# Patient Record
Sex: Female | Born: 2000 | Race: Black or African American | Hispanic: No | Marital: Single | Smoking: Never smoker
Health system: Southern US, Community
[De-identification: ages and names within clinical notes are randomized; demographics above are authoritative.]

## PROBLEM LIST (undated history)

## (undated) DIAGNOSIS — F419 Anxiety disorder, unspecified: Secondary | ICD-10-CM

## (undated) DIAGNOSIS — J45909 Unspecified asthma, uncomplicated: Secondary | ICD-10-CM

## (undated) DIAGNOSIS — F32A Depression, unspecified: Secondary | ICD-10-CM

## (undated) HISTORY — DX: Anxiety disorder, unspecified: F41.9

## (undated) HISTORY — PX: WISDOM TOOTH EXTRACTION: SHX21

## (undated) HISTORY — PX: FOOT SURGERY: SHX648

---
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
## 2020-01-18 ENCOUNTER — Other Ambulatory Visit: Payer: Self-pay | Admitting: Sports Medicine

## 2020-01-18 DIAGNOSIS — M2042 Other hammer toe(s) (acquired), left foot: Secondary | ICD-10-CM

## 2020-01-18 DIAGNOSIS — M2041 Other hammer toe(s) (acquired), right foot: Secondary | ICD-10-CM

## 2020-01-22 DIAGNOSIS — E559 Vitamin D deficiency, unspecified: Secondary | ICD-10-CM | POA: Insufficient documentation

## 2020-03-29 ENCOUNTER — Emergency Department (HOSPITAL_COMMUNITY)
Admission: EM | Admit: 2020-03-29 | Discharge: 2020-03-29 | Disposition: A | Discharge status: Home / Self Care | Payer: Medicaid Other | Attending: Emergency Medicine | Admitting: Emergency Medicine

## 2020-03-29 ENCOUNTER — Other Ambulatory Visit: Payer: Self-pay

## 2020-03-29 ENCOUNTER — Encounter (HOSPITAL_COMMUNITY): Payer: Self-pay

## 2020-03-29 DIAGNOSIS — J45909 Unspecified asthma, uncomplicated: Secondary | ICD-10-CM | POA: Diagnosis not present

## 2020-03-29 DIAGNOSIS — R109 Unspecified abdominal pain: Secondary | ICD-10-CM | POA: Insufficient documentation

## 2020-03-29 DIAGNOSIS — Z20822 Contact with and (suspected) exposure to covid-19: Secondary | ICD-10-CM | POA: Insufficient documentation

## 2020-03-29 DIAGNOSIS — R519 Headache, unspecified: Secondary | ICD-10-CM | POA: Insufficient documentation

## 2020-03-29 DIAGNOSIS — Z7982 Long term (current) use of aspirin: Secondary | ICD-10-CM | POA: Insufficient documentation

## 2020-03-29 DIAGNOSIS — R110 Nausea: Secondary | ICD-10-CM | POA: Diagnosis not present

## 2020-03-29 DIAGNOSIS — R0981 Nasal congestion: Secondary | ICD-10-CM | POA: Diagnosis not present

## 2020-03-29 LAB — RESP PANEL BY RT-PCR (FLU A&B, COVID) ARPGX2
Influenza A by PCR: NEGATIVE
Influenza B by PCR: NEGATIVE
SARS Coronavirus 2 by RT PCR: NEGATIVE

## 2020-03-29 MED ORDER — IBUPROFEN 400 MG PO TABS
400.0000 mg | ORAL_TABLET | Freq: Once | ORAL | Status: AC
  Administered 2020-03-29: 400 mg via ORAL
  Filled 2020-03-29: qty 1

## 2020-03-29 MED ORDER — ONDANSETRON 4 MG PO TBDP
4.0000 mg | ORAL_TABLET | Freq: Once | ORAL | Status: AC
  Administered 2020-03-29: 4 mg via ORAL
  Filled 2020-03-29: qty 1

## 2020-03-29 NOTE — ED Triage Notes (Signed)
Vomiting Wednesday-resolved, headache stomach ache, nausea persists, wants covid test, tylenol last at 12 noon

## 2020-03-29 NOTE — ED Provider Notes (Signed)
[facility] EMERGENCY DEPARTMENT Provider Note   CSN: [id number] Arrival date & time: 03/29/20  1359     History   Chief Complaint Chief Complaint  Patient presents with  . Emesis    HPI Kendra Barajas is a 20 y.o. female who presents due to symptoms of nausea and vomiting that started 3 days ago. Patient notes the vomiting has stopped, however nausea has persisted. Patient has had associated headache, nasal congestion, and abdominal pain. Patient has had known exposure to younger sibling who tested positive for COVID today. Patient does work in Engineer, structural. Patient has been eating and drinking ok. Patient only urinated once today when she woke up. Patient has been taking tylenol for her symptoms with relief. Denies any fever, chills, diarrhea, chest pain, cough, wheezing, back pain, dysuria, hematuria, or loss of taste/smell. Patient is not vaccinated against covid.     HPI  Past Medical History:  Diagnosis Date  . Asthma     Patient Active Problem List   Diagnosis Date Noted  . Atopic dermatitis 11/23/2017    History reviewed. No pertinent surgical history.   OB History   No obstetric history on file.      Home Medications    Prior to Admission medications   Medication Sig Start Date End Date Taking? Authorizing Provider  acetaminophen (TYLENOL) 500 MG tablet Take 1 tablet (500 mg total) by mouth every 6 (six) hours as needed. 04/07/19   Fayrene Helper, PA-C  albuterol (VENTOLIN HFA) 108 (90 Base) MCG/ACT inhaler Inhale 2 puffs into the lungs every 4 (four) hours as needed. 03/12/19   [provider]  aspirin 325 MG EC tablet TAKE 1 TABLET BY MOUTH EVERY DAY 06/18/19   Asencion Islam, DPM  augmented betamethasone dipropionate (DIPROLENE-AF) 0.05 % ointment Apply twice daily to worst areas of eczema only. Switch to triamcinolone once improved 02/27/19   [provider]  cefdinir (OMNICEF) 300 MG capsule Take 300 mg by mouth 2 (two) times daily.  09/21/19   [provider]  cetirizine (ZYRTEC) 10 MG tablet Take 10 mg by mouth daily. 08/11/19   [provider]  DERMA-SMOOTHE/FS SCALP 0.01 % OIL APPLY TO SCALP EVERY OTHER DAY 02/25/19   [provider]  docusate sodium (COLACE) 100 MG capsule Take 1 capsule (100 mg total) by mouth 2 (two) times daily. 06/12/19   Asencion Islam, DPM  DUPIXENT 300 MG/2ML prefilled syringe  12/08/18   [provider]  fluconazole (DIFLUCAN) 150 MG tablet Take 150 mg by mouth once. 03/05/19   [provider]  Fluocinolone Acetonide Body 0.01 % OIL APPLY TO SCALP EVERY OTHER DAY 02/05/19   [provider]  fluocinonide ointment (LIDEX) 0.05 % Apply twice day to worst eczema until improved 03/18/19   [provider]  fluticasone (FLONASE) 50 MCG/ACT nasal spray Place 1 spray into both nostrils daily. 12/19/19   [provider]  hydrocortisone 2.5 % cream APPLY TO FACE 1 TO 2 TIMES A DAY 09/06/19   [provider]  ibuprofen (ADVIL) 800 MG tablet One tablet by mouth with food once daily in the morning 12/27/19   Asencion Islam, DPM  lidocaine (XYLOCAINE) 5 % ointment Apply 1 application topically as needed. For pain in the toes at bedtime 12/27/19   Asencion Islam, DPM  metroNIDAZOLE (FLAGYL) 500 MG tablet Take 500 mg by mouth 2 (two) times daily. 12/11/19   [provider]  SRONYX 0.1-20 MG-MCG tablet TAKE 1 TAB BY MOUTH DAILY  START ON NOVEMBER 12 12/28/18   [provider]  triamcinolone cream (KENALOG) 0.1 % Apply topically. 07/30/19   [provider]  triamcinolone ointment (KENALOG) 0.1 % Apply neck down 1-2 times a day as needed 09/06/19   [provider]    Family History Family History  Problem Relation Age of Onset  . Heart failure Other   . Cancer Maternal Aunt     Social History Social History   Tobacco Use  . Smoking status: Never Smoker  . Smokeless tobacco: Never Used  Substance Use  Topics  . Alcohol use: No  . Drug use: No     Allergies   Patient has no known allergies.   Review of Systems Review of Systems  Constitutional: Negative for activity change and fever.  HENT: Positive for congestion. Negative for trouble swallowing.   Eyes: Negative for discharge and redness.  Respiratory: Negative for cough and wheezing.   Cardiovascular: Negative for chest pain.  Gastrointestinal: Positive for abdominal pain and nausea. Negative for diarrhea and vomiting.  Genitourinary: Negative for decreased urine volume and dysuria.  Musculoskeletal: Negative for gait problem and neck stiffness.  Skin: Negative for rash and wound.  Neurological: Positive for headaches. Negative for seizures and syncope.  Hematological: Does not bruise/bleed easily.  All other systems reviewed and are negative.    Physical Exam Updated Vital Signs BP 118/66 (BP Location: Right Arm)   Pulse 69   Temp 98.3 F (36.8 C) (Oral)   Resp 18   Wt 143 lb 8.3 oz (65.1 kg)   LMP 02/27/2020   SpO2 100%    Physical Exam Vitals and nursing note reviewed.  Constitutional:      General: She is not in acute distress.    Appearance: She is well-developed and well-nourished.  HENT:     Head: Normocephalic and atraumatic.     Right Ear: Tympanic membrane, ear canal and external ear normal.     Left Ear: Tympanic membrane, ear canal and external ear normal.     Nose: Congestion present. No rhinorrhea.     Mouth/Throat:     Mouth: Mucous membranes are moist.     Pharynx: Oropharynx is clear. No oropharyngeal exudate or posterior oropharyngeal erythema.  Eyes:     General: No scleral icterus.    Extraocular Movements: EOM normal.     Conjunctiva/sclera: Conjunctivae normal.  Cardiovascular:     Rate and Rhythm: Normal rate and regular rhythm.     Pulses: Intact distal pulses.     Heart sounds: Normal heart sounds.  Pulmonary:     Effort: Pulmonary effort is normal. No respiratory distress.      Breath sounds: Normal breath sounds. No wheezing, rhonchi or rales.  Abdominal:     General: There is no distension.     Palpations: Abdomen is soft.     Tenderness: There is no abdominal tenderness.  Musculoskeletal:        General: No edema. Normal range of motion.     Cervical back: Normal range of motion and neck supple.  Skin:    General: Skin is warm.     Capillary Refill: Capillary refill takes less than 2 seconds.     Findings: No rash.  Neurological:     Mental Status: She is alert and oriented to person, place, and time. Mental status is at baseline.  Psychiatric:        Mood and Affect: Mood and affect normal.  ED Treatments / Results  Labs (all labs ordered are listed, but only abnormal results are displayed) Labs Reviewed  RESP PANEL BY RT-PCR (FLU A&B, COVID) ARPGX2    EKG    Radiology No results found.  Procedures Procedures (including critical care time)  Medications Ordered in ED Medications  ondansetron (ZOFRAN-ODT) disintegrating tablet 4 mg (4 mg Oral Given 03/29/20 1610)  ibuprofen (ADVIL) tablet 400 mg (400 mg Oral Given 03/29/20 1610)     Initial Impression / Assessment and Plan / ED Course  I have reviewed the triage vital signs and the nursing notes.  Pertinent labs & imaging results that were available during my care of the patient were reviewed by me and considered in my medical decision making (see chart for details).        20 y.o. female with nausea, vomiting, headache, nasal congestion, and abdominal pain.  Suspect viral illness, possibly COVID-19.  Afebrile on arrival with no tachycardia or respiratory distress. Appears well-hydrated and is alert and interactive. No evidence of otitis media or pneumonia on exam and sats 100% on RA.  COVID swab sent from triage and was negative, although sister's in the same household is positive today. Recommended Tylenol or Motrin as needed for fever and headaches and close PCP follow up if  symptoms have not improved. Discussed reasons for return to the ED including respiratory distress, inability to tolerate PO or drop in UOP, or altered mental status.  Discussed isolation/quarantine guidelines per CDC. Caregiver expressed understanding.    Kendra Barajas was evaluated in Emergency Department on 03/29/2020 for the symptoms described in the history of present illness. She was evaluated in the context of the global COVID-19 pandemic, which necessitated consideration that the patient might be at risk for infection with the SARS-CoV-2 virus that causes COVID-19. Institutional protocols and algorithms that pertain to the evaluation of patients at risk for COVID-19 are in a state of rapid change based on information released by regulatory bodies including the CDC and federal and state organizations. These policies and algorithms were followed during the patient's care in the ED.    Final Clinical Impressions(s) / ED Diagnoses   Final diagnoses:  Frontal headache  Close exposure to COVID-19 virus    ED Discharge Orders    None      Willadean Carol, MD     I, Rodrigo Ran, acting as a scribe for Willadean Carol, MD, have documented all relevant documentation on the behalf of and as directed by them while in their presence.    Willadean Carol, MD 03/30/20 2259

## 2020-04-10 ENCOUNTER — Encounter (HOSPITAL_COMMUNITY): Payer: Self-pay | Admitting: Emergency Medicine

## 2020-04-10 ENCOUNTER — Other Ambulatory Visit: Payer: Self-pay

## 2020-04-10 ENCOUNTER — Emergency Department (HOSPITAL_COMMUNITY)
Admission: EM | Admit: 2020-04-10 | Discharge: 2020-04-10 | Disposition: A | Discharge status: Home / Self Care | Payer: Medicaid Other | Attending: Emergency Medicine | Admitting: Emergency Medicine

## 2020-04-10 DIAGNOSIS — U071 COVID-19: Secondary | ICD-10-CM | POA: Insufficient documentation

## 2020-04-10 DIAGNOSIS — Z7982 Long term (current) use of aspirin: Secondary | ICD-10-CM | POA: Diagnosis not present

## 2020-04-10 DIAGNOSIS — R059 Cough, unspecified: Secondary | ICD-10-CM | POA: Diagnosis present

## 2020-04-10 DIAGNOSIS — R1031 Right lower quadrant pain: Secondary | ICD-10-CM | POA: Insufficient documentation

## 2020-04-10 DIAGNOSIS — Z7952 Long term (current) use of systemic steroids: Secondary | ICD-10-CM | POA: Insufficient documentation

## 2020-04-10 DIAGNOSIS — J45909 Unspecified asthma, uncomplicated: Secondary | ICD-10-CM | POA: Diagnosis not present

## 2020-04-10 LAB — COMPREHENSIVE METABOLIC PANEL
ALT: 21 U/L (ref 0–44)
AST: 24 U/L (ref 15–41)
Albumin: 4.1 g/dL (ref 3.5–5.0)
Alkaline Phosphatase: 83 U/L (ref 38–126)
Anion gap: 10 (ref 5–15)
BUN: 6 mg/dL (ref 6–20)
CO2: 23 mmol/L (ref 22–32)
Calcium: 9.2 mg/dL (ref 8.9–10.3)
Chloride: 101 mmol/L (ref 98–111)
Creatinine, Ser: 0.92 mg/dL (ref 0.44–1.00)
GFR, Estimated: >60 mL/min (ref >60)
Glucose, Bld: 90 mg/dL (ref 70–99)
Potassium: 3.8 mmol/L (ref 3.5–5.1)
Sodium: 134 mmol/L — ABNORMAL LOW (ref 135–145)
Total Bilirubin: 0.8 mg/dL (ref 0.3–1.2)
Total Protein: 7.4 g/dL (ref 6.5–8.1)

## 2020-04-10 LAB — URINALYSIS, ROUTINE W REFLEX MICROSCOPIC
Bilirubin Urine: NEGATIVE
Glucose, UA: NEGATIVE mg/dL
Hgb urine dipstick: NEGATIVE
Ketones, ur: 5 mg/dL — AB
Leukocytes,Ua: NEGATIVE
Nitrite: NEGATIVE
Protein, ur: NEGATIVE mg/dL
Specific Gravity, Urine: 1.02 (ref 1.005–1.030)
pH: 8 (ref 5.0–8.0)

## 2020-04-10 LAB — CBC
HCT: 35.9 % — ABNORMAL LOW (ref 36.0–46.0)
Hemoglobin: 12.1 g/dL (ref 12.0–15.0)
MCH: 30.6 pg (ref 26.0–34.0)
MCHC: 33.7 g/dL (ref 30.0–36.0)
MCV: 90.7 fL (ref 80.0–100.0)
Platelets: 201 10*3/uL (ref 150–400)
RBC: 3.96 MIL/uL (ref 3.87–5.11)
RDW: 12.3 % (ref 11.5–15.5)
WBC: 5.4 10*3/uL (ref 4.0–10.5)
nRBC: 0 % (ref 0.0–0.2)

## 2020-04-10 LAB — RESP PANEL BY RT-PCR (FLU A&B, COVID) ARPGX2
Influenza A by PCR: NEGATIVE
Influenza B by PCR: NEGATIVE
SARS Coronavirus 2 by RT PCR: POSITIVE — AB

## 2020-04-10 LAB — I-STAT BETA HCG BLOOD, ED (MC, WL, AP ONLY): I-stat hCG, quantitative: <5 m[IU]/mL (ref <5)

## 2020-04-10 LAB — LIPASE, BLOOD: Lipase: 20 U/L (ref 11–51)

## 2020-04-10 MED ORDER — ACETAMINOPHEN 325 MG PO TABS
650.0000 mg | ORAL_TABLET | Freq: Once | ORAL | Status: AC | PRN
  Administered 2020-04-10: 650 mg via ORAL
  Filled 2020-04-10: qty 2

## 2020-04-10 NOTE — ED Triage Notes (Signed)
Patient complains of abdominal pain, sore throat, and cough that started last night, denies sick contacts, is not vaccinated against COVID. Denies other complaints.

## 2020-04-10 NOTE — Discharge Instructions (Addendum)
Your labs look okay.  Your COVID test is positive.  Unsure if this is an acute infection or from several weeks ago.  Continue Motrin and Tylenol at home.  Drink plenty of fluids.  Your abdominal pain worsens or if you have localized pain to the right lower quadrant of your abdomen as this could be an appendicitis and may need imaging.

## 2020-04-10 NOTE — ED Notes (Signed)
Patient verbalizes understanding of discharge instructions. Opportunity for questioning and answers were provided. Armband removed by staff, pt discharged from ED ambulatory.   

## 2020-04-10 NOTE — ED Provider Notes (Signed)
[facility] EMERGENCY DEPARTMENT Provider Note   CSN: [id number] Arrival date & time: 04/10/20  1631     History Chief Complaint  Patient presents with  . Abdominal Pain    Kendra Barajas is a 20 y.o. female.  HPI 20 year old female presents to the emergency department today for evaluation of sore throat, cough, nasal congestion, abdominal pain, diarrhea.  Symptoms began yesterday.  Abdominal pain began today.  Patient reports the pain is lower abdomen and cramping in nature. Intermittent.  No urinary symptoms.  No vaginal bleeding.  Patient reports that she had sore throat nasal congestion 2 weeks ago was seen in the pediatric ER.  Had negative test at that time however her sister was positive for COVID-19.  Patient is not vaccinated against COVID-19.  Has been taken Tylenol for her fever.  Patient denies any bloody stools.  No shortness of breath.    Past Medical History:  Diagnosis Date  . Asthma     Patient Active Problem List   Diagnosis Date Noted  . Atopic dermatitis 11/23/2017    History reviewed. No pertinent surgical history.   OB History   No obstetric history on file.     Family History  Problem Relation Age of Onset  . Heart failure Other   . Cancer Maternal Aunt     Social History   Tobacco Use  . Smoking status: Never Smoker  . Smokeless tobacco: Never Used  Substance Use Topics  . Alcohol use: No  . Drug use: No    Home Medications Prior to Admission medications   Medication Sig Start Date End Date Taking? Authorizing Provider  acetaminophen (TYLENOL) 500 MG tablet Take 1 tablet (500 mg total) by mouth every 6 (six) hours as needed. 04/07/19   Domenic Moras, PA-C  albuterol (VENTOLIN HFA) 108 (90 Base) MCG/ACT inhaler Inhale 2 puffs into the lungs every 4 (four) hours as needed. 03/12/19   [provider]  aspirin 325 MG EC tablet TAKE 1 TABLET BY MOUTH EVERY DAY 06/18/19   Landis Martins, DPM  augmented betamethasone  dipropionate (DIPROLENE-AF) 0.05 % ointment Apply twice daily to worst areas of eczema only. Switch to triamcinolone once improved 02/27/19   [provider]  cefdinir (OMNICEF) 300 MG capsule Take 300 mg by mouth 2 (two) times daily. 09/21/19   [provider]  cetirizine (ZYRTEC) 10 MG tablet Take 10 mg by mouth daily. 08/11/19   [provider]  DERMA-SMOOTHE/FS SCALP 0.01 % OIL APPLY TO SCALP EVERY OTHER DAY 02/25/19   [provider]  docusate sodium (COLACE) 100 MG capsule Take 1 capsule (100 mg total) by mouth 2 (two) times daily. 06/12/19   Landis Martins, DPM  DUPIXENT 300 MG/2ML prefilled syringe  12/08/18   [provider]  fluconazole (DIFLUCAN) 150 MG tablet Take 150 mg by mouth once. 03/05/19   [provider]  Fluocinolone Acetonide Body 0.01 % OIL APPLY TO SCALP EVERY OTHER DAY 02/05/19   [provider]  fluocinonide ointment (LIDEX) 0.05 % Apply twice day to worst eczema until improved 03/18/19   [provider]  fluticasone (FLONASE) 50 MCG/ACT nasal spray Place 1 spray into both nostrils daily. 12/19/19   [provider]  hydrocortisone 2.5 % cream APPLY TO FACE 1 TO 2 TIMES A DAY 09/06/19   [provider]  ibuprofen (ADVIL) 800 MG tablet One tablet by mouth with food once daily in the morning 12/27/19   Landis Martins, DPM  lidocaine (XYLOCAINE) 5 % ointment Apply 1 application topically as needed. For pain in the toes at bedtime 12/27/19   Landis Martins, DPM  metroNIDAZOLE (FLAGYL) 500 MG tablet Take 500 mg by mouth 2 (two) times daily. 12/11/19   [provider]  SRONYX 0.1-20 MG-MCG tablet TAKE 1 TAB BY MOUTH DAILY START ON NOVEMBER 12 12/28/18   [provider]  triamcinolone cream (KENALOG) 0.1 % Apply topically. 07/30/19   [provider]  triamcinolone ointment (KENALOG) 0.1 % Apply neck down 1-2 times a day as needed 09/06/19   [provider]     Allergies    Patient has no known allergies.  Review of Systems   Review of Systems  Constitutional: Positive for chills and fever.  HENT: Positive for congestion, ear pain and sore throat.   Eyes: Negative for discharge.  Respiratory: Positive for cough. Negative for shortness of breath.   Gastrointestinal: Positive for abdominal pain and diarrhea. Negative for blood in stool, nausea and vomiting.  Genitourinary: Negative for difficulty urinating, dysuria, hematuria and urgency.  Musculoskeletal: Negative for myalgias.  Skin: Negative for color change.  Neurological: Positive for headaches.  Psychiatric/Behavioral: Negative for confusion.    Physical Exam Updated Vital Signs BP 117/68   Pulse (!) 110   Temp 98.9 F (37.2 C) (Oral)   Resp 19   Ht 5\' 7"  (1.702 m)   Wt 62.1 kg   SpO2 100%   BMI 21.46 kg/m   Physical Exam Vitals and nursing note reviewed.  Constitutional:      General: She is not in acute distress.    Appearance: She is well-developed and well-nourished. She is not ill-appearing or toxic-appearing.  HENT:     Head: Normocephalic and atraumatic.     Mouth/Throat:     Mouth: Mucous membranes are moist.     Pharynx: Oropharynx is clear.  Eyes:     General: No scleral icterus.       Right eye: No discharge.        Left eye: No discharge.  Cardiovascular:     Rate and Rhythm: Normal rate and regular rhythm.     Heart sounds: Normal heart sounds. No murmur heard. No friction rub. No gallop.   Pulmonary:     Effort: Pulmonary effort is normal. No respiratory distress.     Breath sounds: Normal breath sounds. No wheezing, rhonchi or rales.  Abdominal:     General: Abdomen is flat. Bowel sounds are normal.     Palpations: Abdomen is soft.     Tenderness: There is generalized abdominal tenderness and tenderness in the right lower quadrant, suprapubic area and left lower quadrant. There is no right CVA tenderness, left CVA tenderness, guarding or  rebound. Negative signs include Murphy's sign and McBurney's sign.  Musculoskeletal:        General: Normal range of motion.     Cervical back: Normal range of motion.  Skin:    General: Skin is warm and dry.     Capillary Refill: Capillary refill takes less than 2 seconds.     Coloration: Skin is not pale.  Neurological:     Mental Status: She is alert.  Psychiatric:        Behavior: Behavior normal.        Thought Content: Thought content normal.        Judgment: Judgment normal.     ED Results / Procedures / Treatments   Labs (all labs ordered are listed, but  only abnormal results are displayed) Labs Reviewed  RESP PANEL BY RT-PCR (FLU A&B, COVID) ARPGX2 - Abnormal; Notable for the following components:      Result Value   SARS Coronavirus 2 by RT PCR POSITIVE (*)    All other components within normal limits  COMPREHENSIVE METABOLIC PANEL - Abnormal; Notable for the following components:   Sodium 134 (*)    All other components within normal limits  CBC - Abnormal; Notable for the following components:   HCT 35.9 (*)    All other components within normal limits  URINALYSIS, ROUTINE W REFLEX MICROSCOPIC - Abnormal; Notable for the following components:   APPearance HAZY (*)    Ketones, ur 5 (*)    All other components within normal limits  LIPASE, BLOOD  I-STAT BETA HCG BLOOD, ED ([facility], [facility], [facility] ONLY)    EKG None  Radiology No results found.  Procedures Procedures (including critical care time)  Medications Ordered in ED Medications  acetaminophen (TYLENOL) tablet 650 mg (has no administration in time range)    ED Course  I have reviewed the triage vital signs and the nursing notes.  Pertinent labs & imaging results that were available during my care of the patient were reviewed by me and considered in my medical decision making (see chart for details).    MDM Rules/Calculators/A&P                          20 year old presents to the emergency department  today for concern for fever, cough, abdominal pain, diarrhea, sore throat.  On exam patient was noted to be febrile in the triage room however this improved with medications.  She is mildly tachycardic likely secondary to infection.  Oropharynx is clear.  Doubt strep pharyngitis.  No signs of peritonsillar abscess or deep neck infection.  No nuchal rigidity.  Lungs clear to auscultation bilaterally.  No focal abdominal tenderness.  Bowel sounds are present.  No CVA tenderness.  Labs were reviewed.  No leukocytosis.  UA without concern for infection.  No significant electrolyte derangement.  Normal kidney function and liver enzymes.  COVID test was positive.  Patient reports having symptoms 2 weeks ago with a sister that tested positive.  Patient had negative test in the emergency room at that time.  Symptoms seem consistent with COVID-19.  Patient is not toxic or septic appearing.  Doubt pneumonia.  No indication for imaging at this time.  Abdominal pain is not focal doubt any acute infection abdomen including appendicitis, nephrolithiasis, pyelonephritis, UTI, diverticulitis, cholecystitis.  Do not feel that advanced imaging is indicated.  Patient is not hypoxic.  No indication for admission at this time.  Discussed Intermatic treatment at home and quarantine precautions with patient.  Patient encouraged to follow primary care doctor and return to ER any worsening symptoms.  Pt is hemodynamically stable, in NAD, & able to ambulate in the ED. Evaluation does not show pathology that would require ongoing emergent intervention or inpatient treatment. I explained the diagnosis to the patient. Pain has been managed & has no complaints prior to dc. Pt is comfortable with above plan and is stable for discharge at this time. All questions were answered prior to disposition. Strict return precautions for f/u to the ED were discussed. Encouraged follow up with PCP.   Final Clinical Impression(s) / ED Diagnoses Final  diagnoses:  [id number]    Rx / DC Orders ED Discharge Orders    None  Doristine Devoid, PA-C 04/10/20 2327    Orpah Greek, MD 04/10/20 2340

## 2020-05-01 ENCOUNTER — Encounter: Payer: Self-pay | Admitting: Sports Medicine

## 2020-05-01 ENCOUNTER — Ambulatory Visit (INDEPENDENT_AMBULATORY_CARE_PROVIDER_SITE_OTHER): Payer: Medicaid Other | Admitting: Sports Medicine

## 2020-05-01 ENCOUNTER — Ambulatory Visit (INDEPENDENT_AMBULATORY_CARE_PROVIDER_SITE_OTHER): Payer: Medicaid Other

## 2020-05-01 ENCOUNTER — Other Ambulatory Visit: Payer: Self-pay

## 2020-05-01 DIAGNOSIS — L905 Scar conditions and fibrosis of skin: Secondary | ICD-10-CM

## 2020-05-01 DIAGNOSIS — M79671 Pain in right foot: Secondary | ICD-10-CM | POA: Diagnosis not present

## 2020-05-01 DIAGNOSIS — M2041 Other hammer toe(s) (acquired), right foot: Secondary | ICD-10-CM | POA: Diagnosis not present

## 2020-05-01 MED ORDER — MELOXICAM 7.5 MG PO TABS: 7.5000 mg | ORAL_TABLET | Freq: Every day | ORAL | 0 refills | Status: DC

## 2020-05-01 NOTE — Progress Notes (Signed)
Subjective: Kendra Barajas is a 20 y.o. female patient seen today in office for check of hammertoes. Patient is s/p toe surgery for hammertoes and reports pain mostly when walking and over the last 2 weeks getting worse, achy and sharp in nature, denies shortness of breath, nausea, vomiting, fever, or chills. Using ice with no relief. Denies recent injury or trauma. No other issues noted.   Patient Active Problem List   Diagnosis Date Noted  . Atopic dermatitis 11/23/2017    Current Outpatient Medications on File Prior to Visit  Medication Sig Dispense Refill  . acetaminophen (TYLENOL) 500 MG tablet Take 1 tablet (500 mg total) by mouth every 6 (six) hours as needed. 30 tablet 0  . albuterol (VENTOLIN HFA) 108 (90 Base) MCG/ACT inhaler Inhale 2 puffs into the lungs every 4 (four) hours as needed.    Marland Kitchen aspirin 325 MG EC tablet TAKE 1 TABLET BY MOUTH EVERY DAY 30 tablet 0  . augmented betamethasone dipropionate (DIPROLENE-AF) 0.05 % ointment Apply twice daily to worst areas of eczema only. Switch to triamcinolone once improved    . cefdinir (OMNICEF) 300 MG capsule Take 300 mg by mouth 2 (two) times daily.    . cetirizine (ZYRTEC) 10 MG tablet Take 10 mg by mouth daily.    . DERMA-SMOOTHE/FS SCALP 0.01 % OIL APPLY TO SCALP EVERY OTHER DAY    . docusate sodium (COLACE) 100 MG capsule Take 1 capsule (100 mg total) by mouth 2 (two) times daily. 10 capsule 0  . DUPIXENT 300 MG/2ML prefilled syringe     . fluconazole (DIFLUCAN) 150 MG tablet Take 150 mg by mouth once.    . Fluocinolone Acetonide Body 0.01 % OIL APPLY TO SCALP EVERY OTHER DAY    . fluocinonide ointment (LIDEX) 0.05 % Apply twice day to worst eczema until improved    . fluticasone (FLONASE) 50 MCG/ACT nasal spray Place 1 spray into both nostrils daily.    . hydrocortisone 2.5 % cream APPLY TO FACE 1 TO 2 TIMES A DAY    . ibuprofen (ADVIL) 800 MG tablet One tablet by mouth with food once daily in the morning 30 tablet 0  . lidocaine  (XYLOCAINE) 5 % ointment Apply 1 application topically as needed. For pain in the toes at bedtime 35.44 g 0  . metroNIDAZOLE (FLAGYL) 500 MG tablet Take 500 mg by mouth 2 (two) times daily.    . SRONYX 0.1-20 MG-MCG tablet TAKE 1 TAB BY MOUTH DAILY START ON NOVEMBER 12    . triamcinolone cream (KENALOG) 0.1 % Apply topically.    . triamcinolone ointment (KENALOG) 0.1 % Apply neck down 1-2 times a day as needed     No current facility-administered medications on file prior to visit.    No Known Allergies  Objective: There were no vitals filed for this visit.  General: No acute distress, AAOx3  Right/Left foot: Incisions and surgical sites well-healed with mild scars to toes, no erythema, no warmth, no drainage, no signs of infection noted, Capillary fill time <3 seconds in all digits, gross sensation present via light touch to right/left foot. Mild pain to palpation of toes on right worse at 4th toe.  No pain with calf compression.   Xrays consistent with post op status arthrodesis at lesser IPJs like previous however there is flexion noted at the DIPJ 4-5 toes.  Assessment and Plan:  Problem List Items Addressed This Visit   None   Visit Diagnoses    Hammertoe of  right foot    -  Primary   Relevant Orders   DG Foot Complete Right (Completed)   Pain in right foot       Scar tissue           -Patient seen and evaluated  -Xrays reviewed  -Rx Mobic for pain and inflammation -Dispensed darco toe splint to use at right 4th toe -Encouraged patient to continue with gentle massage of the toes like previous at areas of scars -Recommend good supportive shoes daily that do no rub the toes like previous -Return if fail to continue to improve after 1 month.   Landis Martins, DPM

## 2020-05-09 ENCOUNTER — Other Ambulatory Visit: Payer: Self-pay | Admitting: Pediatrics

## 2020-05-09 ENCOUNTER — Ambulatory Visit
Admission: RE | Admit: 2020-05-09 | Discharge: 2020-05-09 | Disposition: A | Discharge status: Home / Self Care | Payer: Medicaid Other | Source: Ambulatory Visit | Attending: Pediatrics | Admitting: Pediatrics

## 2020-05-09 ENCOUNTER — Other Ambulatory Visit: Payer: Self-pay

## 2020-05-09 DIAGNOSIS — R109 Unspecified abdominal pain: Secondary | ICD-10-CM

## 2020-05-29 ENCOUNTER — Encounter: Payer: Self-pay | Admitting: Sports Medicine

## 2020-05-29 ENCOUNTER — Other Ambulatory Visit: Payer: Self-pay

## 2020-05-29 ENCOUNTER — Ambulatory Visit (INDEPENDENT_AMBULATORY_CARE_PROVIDER_SITE_OTHER): Payer: Medicaid Other | Admitting: Sports Medicine

## 2020-05-29 DIAGNOSIS — M2041 Other hammer toe(s) (acquired), right foot: Secondary | ICD-10-CM

## 2020-05-29 DIAGNOSIS — L905 Scar conditions and fibrosis of skin: Secondary | ICD-10-CM

## 2020-05-29 DIAGNOSIS — M79671 Pain in right foot: Secondary | ICD-10-CM | POA: Diagnosis not present

## 2020-05-29 DIAGNOSIS — M6240 Contracture of muscle, unspecified site: Secondary | ICD-10-CM

## 2020-05-29 NOTE — Progress Notes (Signed)
Subjective: Kendra Barajas is a 20 y.o. female patient seen today in office for check of hammertoes. Patient is s/p toe surgery but states that even with the splint she is having pain still over the right fourth toe. Mobic has not helped.  Patient is assisted by mom this visit.  Patient Active Problem List   Diagnosis Date Noted  . Atopic dermatitis 11/23/2017    Current Outpatient Medications on File Prior to Visit  Medication Sig Dispense Refill  . acetaminophen (TYLENOL) 500 MG tablet Take 1 tablet (500 mg total) by mouth every 6 (six) hours as needed. 30 tablet 0  . albuterol (VENTOLIN HFA) 108 (90 Base) MCG/ACT inhaler Inhale 2 puffs into the lungs every 4 (four) hours as needed.    Marland Kitchen aspirin 325 MG EC tablet TAKE 1 TABLET BY MOUTH EVERY DAY 30 tablet 0  . augmented betamethasone dipropionate (DIPROLENE-AF) 0.05 % ointment Apply twice daily to worst areas of eczema only. Switch to triamcinolone once improved    . cefdinir (OMNICEF) 300 MG capsule Take 300 mg by mouth 2 (two) times daily.    . cetirizine (ZYRTEC) 10 MG tablet Take 10 mg by mouth daily.    . DERMA-SMOOTHE/FS SCALP 0.01 % OIL APPLY TO SCALP EVERY OTHER DAY    . docusate sodium (COLACE) 100 MG capsule Take 1 capsule (100 mg total) by mouth 2 (two) times daily. 10 capsule 0  . DUPIXENT 300 MG/2ML prefilled syringe     . fluconazole (DIFLUCAN) 150 MG tablet Take 150 mg by mouth once.    . Fluocinolone Acetonide Body 0.01 % OIL APPLY TO SCALP EVERY OTHER DAY    . fluocinonide ointment (LIDEX) 0.05 % Apply twice day to worst eczema until improved    . fluticasone (FLONASE) 50 MCG/ACT nasal spray Place 1 spray into both nostrils daily.    . hydrocortisone 2.5 % cream APPLY TO FACE 1 TO 2 TIMES A DAY    . ibuprofen (ADVIL) 800 MG tablet One tablet by mouth with food once daily in the morning 30 tablet 0  . lidocaine (XYLOCAINE) 5 % ointment Apply 1 application topically as needed. For pain in the toes at bedtime 35.44 g 0  .  meloxicam (MOBIC) 7.5 MG tablet Take 1 tablet (7.5 mg total) by mouth daily. 30 tablet 0  . metroNIDAZOLE (FLAGYL) 500 MG tablet Take 500 mg by mouth 2 (two) times daily.    . SRONYX 0.1-20 MG-MCG tablet TAKE 1 TAB BY MOUTH DAILY START ON NOVEMBER 12    . triamcinolone cream (KENALOG) 0.1 % Apply topically.    . triamcinolone ointment (KENALOG) 0.1 % Apply neck down 1-2 times a day as needed     No current facility-administered medications on file prior to visit.    No Known Allergies  Objective: There were no vitals filed for this visit.  General: No acute distress, AAOx3  Right/Left foot: Incisions and surgical sites well-healed with mild scars to toes, no erythema, no warmth, no drainage, no signs of infection noted, Capillary fill time <3 seconds in all digits, gross sensation present via light touch to right/left foot. Mild pain to palpation of toes on right worse at 4th toe>5th toes.  No pain with calf compression.    Assessment and Plan:  Problem List Items Addressed This Visit   None   Visit Diagnoses    Hammertoe of right foot    -  Primary   Pain in right foot  Contracture of tendon sheath       Scar tissue           -Patient seen and evaluated  -Previous Xrays reviewed  -Patient opt for surgical management. Consent obtained for release of flexor tendon with removal of bone at DIPJ on right 4th toe and right 5th toe at [facility]  Pre and Post op course explained. Risks, benefits, alternatives explained. No guarantees given or implied. Surgical booking slip submitted and provided patient with Surgical packet and info for [place] -To dispense surgical shoe at [facility] -Must remain out of work estimated maximum time 1 month -Return to office after surgery or sooner if problems or issues arise.  Landis Martins, DPM

## 2020-06-13 ENCOUNTER — Ambulatory Visit
Admission: RE | Admit: 2020-06-13 | Discharge: 2020-06-13 | Disposition: A | Discharge status: Home / Self Care | Payer: Medicaid Other | Source: Ambulatory Visit | Attending: Nurse Practitioner | Admitting: Nurse Practitioner

## 2020-06-13 ENCOUNTER — Other Ambulatory Visit: Payer: Self-pay | Admitting: Nurse Practitioner

## 2020-06-13 DIAGNOSIS — R109 Unspecified abdominal pain: Secondary | ICD-10-CM

## 2020-06-19 ENCOUNTER — Telehealth: Payer: Self-pay

## 2020-06-19 NOTE — Telephone Encounter (Addendum)
ERROR

## 2020-06-25 ENCOUNTER — Telehealth: Payer: Self-pay | Admitting: Urology

## 2020-06-25 NOTE — Telephone Encounter (Signed)
DOS - 06/30/20   TENOTOMY 4TH AND 5TH RIGHT --- 03013 EXOSTECTOMY 4TH AND 5TH RIGHT ---- [place], CPT CODES 14388 X'S 2 AND 87579 X'S 2 HAVE BEEN APPROVED, [place] #[id number] GOOD FROM 06/30/20 - 09/28/20. REF # [id number]

## 2020-06-29 ENCOUNTER — Other Ambulatory Visit: Payer: Self-pay | Admitting: Sports Medicine

## 2020-06-29 NOTE — Progress Notes (Signed)
Post op meds entered 

## 2020-06-30 ENCOUNTER — Encounter: Payer: Self-pay | Admitting: Sports Medicine

## 2020-06-30 DIAGNOSIS — M2041 Other hammer toe(s) (acquired), right foot: Secondary | ICD-10-CM

## 2020-06-30 MED ORDER — DOCUSATE SODIUM 100 MG PO CAPS
100.0000 mg | ORAL_CAPSULE | Freq: Two times a day (BID) | ORAL | 0 refills | Status: DC
Start: 2020-06-30 — End: 2020-10-08

## 2020-06-30 MED ORDER — IBUPROFEN 800 MG PO TABS
ORAL_TABLET | ORAL | 0 refills | Status: DC
Start: 2020-06-30 — End: 2020-10-08

## 2020-06-30 MED ORDER — OXYCODONE-ACETAMINOPHEN 7.5-325 MG PO TABS: 1.0000 | ORAL_TABLET | Freq: Four times a day (QID) | ORAL | 0 refills | Status: AC | PRN

## 2020-06-30 MED ORDER — PROMETHAZINE HCL 12.5 MG PO TABS: 12.5000 mg | ORAL_TABLET | Freq: Three times a day (TID) | ORAL | 0 refills | Status: DC | PRN

## 2020-06-30 NOTE — Progress Notes (Signed)
Excuse for Mom

## 2020-07-01 ENCOUNTER — Telehealth: Payer: Self-pay | Admitting: Sports Medicine

## 2020-07-01 NOTE — Telephone Encounter (Signed)
Post op check phone call made to patient. Called mother to check to see how patient is doing.  Mother reports that patient is doing really good did have some pain but otherwise had a really good night.  Mom reports that her toes are hanging over the surgical shoe that she was giving on yesterday.  Advised mom that it sounds like her shoe is too small and at next office visit we will exchange it out for a larger size.  I encouraged mom to continue to have patient to rest ice elevate and to call office if there are any problems or issues.  I confirmed that her mother received excuse for work on yesterday.  Mom thanked me for calling. -Dr. Cannon Kettle

## 2020-07-10 ENCOUNTER — Ambulatory Visit (INDEPENDENT_AMBULATORY_CARE_PROVIDER_SITE_OTHER): Payer: Medicaid Other | Admitting: Sports Medicine

## 2020-07-10 ENCOUNTER — Ambulatory Visit (INDEPENDENT_AMBULATORY_CARE_PROVIDER_SITE_OTHER): Payer: Medicaid Other

## 2020-07-10 ENCOUNTER — Encounter: Payer: Self-pay | Admitting: Sports Medicine

## 2020-07-10 ENCOUNTER — Other Ambulatory Visit: Payer: Self-pay

## 2020-07-10 DIAGNOSIS — M6240 Contracture of muscle, unspecified site: Secondary | ICD-10-CM

## 2020-07-10 DIAGNOSIS — M2041 Other hammer toe(s) (acquired), right foot: Secondary | ICD-10-CM

## 2020-07-10 DIAGNOSIS — M79671 Pain in right foot: Secondary | ICD-10-CM

## 2020-07-10 DIAGNOSIS — L905 Scar conditions and fibrosis of skin: Secondary | ICD-10-CM

## 2020-07-10 MED ORDER — OXYCODONE-ACETAMINOPHEN 7.5-325 MG PO TABS: 1.0000 | ORAL_TABLET | Freq: Four times a day (QID) | ORAL | 0 refills | Status: AC | PRN

## 2020-07-10 NOTE — Progress Notes (Signed)
Subjective: Kristie Bracewell is a 20 y.o. female patient seen today in office for POV #1 (DOS 06/30/2020), S/P removal of bone and release of tenderness at the right fourth toe and fifth toe.  Patient admits pain at surgical site because she bumped her toes yesterday on a brick, denies calf pain, denies headache, chest pain, shortness of breath, nausea, vomiting, fever, or chills.  Patient is assisted by mom at this visit. No other issues noted.   Patient Active Problem List   Diagnosis Date Noted  . Atopic dermatitis 11/23/2017    Current Outpatient Medications on File Prior to Visit  Medication Sig Dispense Refill  . acetaminophen (TYLENOL) 500 MG tablet Take 1 tablet (500 mg total) by mouth every 6 (six) hours as needed. 30 tablet 0  . albuterol (VENTOLIN HFA) 108 (90 Base) MCG/ACT inhaler Inhale 2 puffs into the lungs every 4 (four) hours as needed.    Marland Kitchen aspirin 325 MG EC tablet TAKE 1 TABLET BY MOUTH EVERY DAY 30 tablet 0  . augmented betamethasone dipropionate (DIPROLENE-AF) 0.05 % ointment Apply twice daily to worst areas of eczema only. Switch to triamcinolone once improved    . cefdinir (OMNICEF) 300 MG capsule Take 300 mg by mouth 2 (two) times daily.    . cetirizine (ZYRTEC) 10 MG tablet Take 10 mg by mouth daily.    . DERMA-SMOOTHE/FS SCALP 0.01 % OIL APPLY TO SCALP EVERY OTHER DAY    . docusate sodium (COLACE) 100 MG capsule Take 1 capsule (100 mg total) by mouth 2 (two) times daily. 10 capsule 0  . DUPIXENT 300 MG/2ML prefilled syringe     . fluconazole (DIFLUCAN) 150 MG tablet Take 150 mg by mouth once.    . Fluocinolone Acetonide Body 0.01 % OIL APPLY TO SCALP EVERY OTHER DAY    . fluocinonide ointment (LIDEX) 0.05 % Apply twice day to worst eczema until improved    . fluticasone (FLONASE) 50 MCG/ACT nasal spray Place 1 spray into both nostrils daily.    . hydrocortisone 2.5 % cream APPLY TO FACE 1 TO 2 TIMES A DAY    . ibuprofen (ADVIL) 800 MG tablet One tablet by mouth with food  once daily in the morning 30 tablet 0  . lidocaine (XYLOCAINE) 5 % ointment Apply 1 application topically as needed. For pain in the toes at bedtime 35.44 g 0  . meloxicam (MOBIC) 7.5 MG tablet Take 1 tablet (7.5 mg total) by mouth daily. 30 tablet 0  . metroNIDAZOLE (FLAGYL) 500 MG tablet Take 500 mg by mouth 2 (two) times daily.    . promethazine (PHENERGAN) 12.5 MG tablet Take 1 tablet (12.5 mg total) by mouth every 8 (eight) hours as needed for nausea or vomiting. 20 tablet 0  . SRONYX 0.1-20 MG-MCG tablet TAKE 1 TAB BY MOUTH DAILY START ON NOVEMBER 12    . triamcinolone cream (KENALOG) 0.1 % Apply topically.    . triamcinolone ointment (KENALOG) 0.1 % Apply neck down 1-2 times a day as needed     No current facility-administered medications on file prior to visit.    No Known Allergies  Objective: There were no vitals filed for this visit.  General: No acute distress, AAOx3  Right foot: Sutures intact with no gapping or dehiscence at surgical site, mild swelling to right fourth and fifth toes, no erythema, no warmth, no drainage, no signs of infection noted, Capillary fill time <3 seconds in all digits, gross sensation present via light touch to  right foot.  Mild guarding due to pain on right foot.  Post Op Xray, Right foot: Status post arthroplasty of right fourth and fifth toes, soft tissue swelling within normal limits for post op status.   Assessment and Plan:  Problem List Items Addressed This Visit   None   Visit Diagnoses    Hammertoe of right foot    -  Primary   Relevant Orders   DG Foot Complete Right   Pain in right foot       Contracture of tendon sheath       Scar tissue           -Patient seen and evaluated - X-rays reviewed -Applied dry sterile dressing to surgical site right foot secured with ACE wrap and stockinet  -Advised patient to make sure to keep dressings clean, dry, and intact to right surgical site, removing the ACE as needed  -Advised patient to  continue with post-op shoe on right foot -Advised patient to limit activity to necessity  -Advised patient to ice and elevate as directed -Refill Percocet for pain and may take Motrin for any breakthrough pain -Will plan for suture removal at next office visit. In the meantime, patient to call office if any issues or problems arise.   Landis Martins, DPM

## 2020-07-17 ENCOUNTER — Other Ambulatory Visit: Payer: Self-pay

## 2020-07-17 ENCOUNTER — Ambulatory Visit (INDEPENDENT_AMBULATORY_CARE_PROVIDER_SITE_OTHER): Payer: Medicaid Other | Admitting: Podiatry

## 2020-07-17 DIAGNOSIS — Z9889 Other specified postprocedural states: Secondary | ICD-10-CM

## 2020-07-20 NOTE — Progress Notes (Signed)
  Subjective:  Patient ID: Kendra Barajas, female    DOB: [birth year],  MRN: [id number]  Chief Complaint  Patient presents with  . Routine Post Op    POV #2 DOS 06/30/2020 RELEASE OF TENDON AT RT 4TH TOE W/POSSIBLE REMOVAL OF MOVE BONE AT TOE JOINT OF THE RT 4TH TOE AS WELL AS RT 5TH TOE/Kendra Barajas PT    DOS: 06/30/2020 Procedure: Fourth and fifth toe arthroplasty  20 y.o. female returns for post-op check.  Doing well  Review of Systems: Negative except as noted in the HPI. Denies N/V/F/Ch.   Objective:  There were no vitals filed for this visit. There is no height or weight on file to calculate BMI. Constitutional Well developed. Well nourished.  Vascular Foot warm and well perfused. Capillary refill normal to all digits.   Neurologic Normal speech. Oriented to person, place, and time. Epicritic sensation to light touch grossly present bilaterally.  Dermatologic Skin healing well without signs of infection. Skin edges well coapted without signs of infection.  Orthopedic: Tenderness to palpation noted about the surgical site.   Assessment:   1. Status post right foot surgery    Plan:  Patient was evaluated and treated and all questions answered.  S/p foot surgery right -Progressing as expected post-operatively. -WB Status: WBAT in regular shoe gears when tolerated -Sutures: Removed today. -Medications: No refills required -Foot redressed.  Return in about 2 weeks (around 07/31/2020) for with Kendra Kendra Barajas.

## 2020-07-31 ENCOUNTER — Encounter: Payer: Medicaid Other | Admitting: Sports Medicine

## 2020-08-21 ENCOUNTER — Ambulatory Visit (INDEPENDENT_AMBULATORY_CARE_PROVIDER_SITE_OTHER): Payer: Medicaid Other | Admitting: Sports Medicine

## 2020-08-21 ENCOUNTER — Other Ambulatory Visit: Payer: Self-pay

## 2020-08-21 ENCOUNTER — Encounter: Payer: Self-pay | Admitting: Sports Medicine

## 2020-08-21 DIAGNOSIS — M2041 Other hammer toe(s) (acquired), right foot: Secondary | ICD-10-CM

## 2020-08-21 DIAGNOSIS — M6240 Contracture of muscle, unspecified site: Secondary | ICD-10-CM

## 2020-08-21 DIAGNOSIS — Z9889 Other specified postprocedural states: Secondary | ICD-10-CM

## 2020-08-21 DIAGNOSIS — M79671 Pain in right foot: Secondary | ICD-10-CM

## 2020-08-21 NOTE — Progress Notes (Signed)
Subjective: Kendra Barajas is a 20 y.o. female patient seen today in office for POV #3 (DOS 06/30/2020), S/P removal of bone and release of tenderness at the right fourth toe and fifth toe.  Patient states that her toes are doing better, just a [place] pain now and then. Denies constitutional symptoms.  Patient Active Problem List   Diagnosis Date Noted  . Vitamin D deficiency 01/22/2020  . Atopic dermatitis 11/23/2017    Current Outpatient Medications on File Prior to Visit  Medication Sig Dispense Refill  . acetaminophen (TYLENOL) 500 MG tablet Take 1 tablet (500 mg total) by mouth every 6 (six) hours as needed. 30 tablet 0  . albuterol (VENTOLIN HFA) 108 (90 Base) MCG/ACT inhaler Inhale 2 puffs into the lungs every 4 (four) hours as needed.    Marland Kitchen amoxicillin (AMOXIL) 875 MG tablet Take 875 mg by mouth 2 (two) times daily.    Marland Kitchen aspirin 325 MG EC tablet TAKE 1 TABLET BY MOUTH EVERY DAY 30 tablet 0  . augmented betamethasone dipropionate (DIPROLENE-AF) 0.05 % ointment Apply twice daily to worst areas of eczema only. Switch to triamcinolone once improved    . cefdinir (OMNICEF) 300 MG capsule Take 300 mg by mouth 2 (two) times daily.    . cetirizine (ZYRTEC) 10 MG tablet Take 10 mg by mouth daily.    . cetirizine HCl (ZYRTEC) 1 MG/ML solution Take by mouth.    . DERMA-SMOOTHE/FS SCALP 0.01 % OIL APPLY TO SCALP EVERY OTHER DAY    . docusate sodium (COLACE) 100 MG capsule Take 1 capsule (100 mg total) by mouth 2 (two) times daily. 10 capsule 0  . doxycycline (VIBRA-TABS) 100 MG tablet Take 100 mg by mouth 2 (two) times daily.    . DUPIXENT 300 MG/2ML prefilled syringe     . ergocalciferol (VITAMIN D2) 1.25 MG (50000 UT) capsule Take by mouth.    . escitalopram (LEXAPRO) 10 MG tablet Take 1 tablet by mouth every morning.    . fluconazole (DIFLUCAN) 150 MG tablet Take 150 mg by mouth once.    . Fluocinolone Acetonide Body 0.01 % OIL APPLY TO SCALP EVERY OTHER DAY    . fluocinonide ointment (LIDEX)  0.05 % Apply twice day to worst eczema until improved    . fluticasone (FLONASE) 50 MCG/ACT nasal spray Place 1 spray into both nostrils daily.    . hydrocortisone 2.5 % cream APPLY TO FACE 1 TO 2 TIMES A DAY    . hydrOXYzine (ATARAX/VISTARIL) 25 MG tablet Take 25 mg by mouth 2 (two) times daily as needed.    Marland Kitchen ibuprofen (ADVIL) 800 MG tablet One tablet by mouth with food once daily in the morning 30 tablet 0  . lidocaine (XYLOCAINE) 5 % ointment Apply 1 application topically as needed. For pain in the toes at bedtime 35.44 g 0  . meloxicam (MOBIC) 7.5 MG tablet Take 1 tablet (7.5 mg total) by mouth daily. 30 tablet 0  . metroNIDAZOLE (FLAGYL) 500 MG tablet Take 500 mg by mouth 2 (two) times daily.    . promethazine (PHENERGAN) 12.5 MG tablet Take 1 tablet (12.5 mg total) by mouth every 8 (eight) hours as needed for nausea or vomiting. 20 tablet 0  . sertraline (ZOLOFT) 50 MG tablet Take 1 tablet by mouth at bedtime.    . SRONYX 0.1-20 MG-MCG tablet TAKE 1 TAB BY MOUTH DAILY START ON NOVEMBER 12    . triamcinolone cream (KENALOG) 0.1 % Apply topically.    Marland Kitchen  triamcinolone ointment (KENALOG) 0.1 % Apply neck down 1-2 times a day as needed     No current facility-administered medications on file prior to visit.    No Known Allergies  Objective: There were no vitals filed for this visit.  General: No acute distress, AAOx3  Right foot: Incision well healed no gapping or dehiscence at surgical sites, minimal swelling to right fourth and fifth toes, no erythema, no warmth, no drainage, no signs of infection noted, Capillary fill time <3 seconds in all digits, gross sensation present via light touch to right foot.  Minimal guarding due to pain on right foot.  Assessment and Plan:  Problem List Items Addressed This Visit   None   Visit Diagnoses    Status post right foot surgery    -  Primary   Hammertoe of right foot       Pain in right foot       Contracture of tendon sheath            -Patient seen and evaluated -Recommend range of motion and scar care -Continue with good supportive shoes  -Continue motrin if neeed -Will plan for final POV and Xrays at next office visit. In the meantime, patient to call office if any issues or problems arise.   Landis Martins, DPM

## 2020-10-05 ENCOUNTER — Other Ambulatory Visit: Payer: Self-pay

## 2020-10-05 ENCOUNTER — Inpatient Hospital Stay (HOSPITAL_COMMUNITY): Payer: Medicaid Other

## 2020-10-05 ENCOUNTER — Encounter (HOSPITAL_COMMUNITY): Payer: Self-pay | Admitting: Family Medicine

## 2020-10-05 ENCOUNTER — Inpatient Hospital Stay (HOSPITAL_COMMUNITY)
Admission: AD | Admit: 2020-10-05 | Discharge: 2020-10-06 | Disposition: A | Discharge status: Home / Self Care | Payer: Medicaid Other | Attending: Family Medicine | Admitting: Family Medicine

## 2020-10-05 DIAGNOSIS — F32A Depression, unspecified: Secondary | ICD-10-CM | POA: Diagnosis not present

## 2020-10-05 DIAGNOSIS — O26891 Other specified pregnancy related conditions, first trimester: Secondary | ICD-10-CM | POA: Diagnosis present

## 2020-10-05 DIAGNOSIS — O3680X0 Pregnancy with inconclusive fetal viability, not applicable or unspecified: Secondary | ICD-10-CM | POA: Diagnosis not present

## 2020-10-05 DIAGNOSIS — O99511 Diseases of the respiratory system complicating pregnancy, first trimester: Secondary | ICD-10-CM | POA: Insufficient documentation

## 2020-10-05 DIAGNOSIS — Z3A01 Less than 8 weeks gestation of pregnancy: Secondary | ICD-10-CM | POA: Diagnosis not present

## 2020-10-05 DIAGNOSIS — Z79899 Other long term (current) drug therapy: Secondary | ICD-10-CM | POA: Diagnosis not present

## 2020-10-05 DIAGNOSIS — Z791 Long term (current) use of non-steroidal anti-inflammatories (NSAID): Secondary | ICD-10-CM | POA: Diagnosis not present

## 2020-10-05 DIAGNOSIS — Z7982 Long term (current) use of aspirin: Secondary | ICD-10-CM | POA: Insufficient documentation

## 2020-10-05 DIAGNOSIS — R102 Pelvic and perineal pain: Secondary | ICD-10-CM | POA: Diagnosis not present

## 2020-10-05 DIAGNOSIS — O99341 Other mental disorders complicating pregnancy, first trimester: Secondary | ICD-10-CM | POA: Diagnosis not present

## 2020-10-05 DIAGNOSIS — J45909 Unspecified asthma, uncomplicated: Secondary | ICD-10-CM | POA: Insufficient documentation

## 2020-10-05 DIAGNOSIS — O26899 Other specified pregnancy related conditions, unspecified trimester: Secondary | ICD-10-CM

## 2020-10-05 DIAGNOSIS — Z6790 Unspecified blood type, Rh positive: Secondary | ICD-10-CM

## 2020-10-05 HISTORY — DX: Depression, unspecified: F32.A

## 2020-10-05 LAB — URINALYSIS, ROUTINE W REFLEX MICROSCOPIC
Bilirubin Urine: NEGATIVE
Glucose, UA: NEGATIVE mg/dL
Hgb urine dipstick: NEGATIVE
Ketones, ur: NEGATIVE mg/dL
Leukocytes,Ua: NEGATIVE
Nitrite: NEGATIVE
Protein, ur: NEGATIVE mg/dL
Specific Gravity, Urine: 1.006 (ref 1.005–1.030)
pH: 6 (ref 5.0–8.0)

## 2020-10-05 LAB — POCT PREGNANCY, URINE: Preg Test, Ur: POSITIVE — AB

## 2020-10-05 NOTE — MAU Note (Signed)
Pt reports lower abd pain x 2 days, diarrhea x 24 hours. Positive preg test on Friday.

## 2020-10-06 DIAGNOSIS — O3680X0 Pregnancy with inconclusive fetal viability, not applicable or unspecified: Secondary | ICD-10-CM

## 2020-10-06 LAB — CBC WITH DIFFERENTIAL/PLATELET
Abs Immature Granulocytes: 0.02 10*3/uL (ref 0.00–0.07)
Basophils Absolute: 0 10*3/uL (ref 0.0–0.1)
Basophils Relative: 0 %
Eosinophils Absolute: 0.1 10*3/uL (ref 0.0–0.5)
Eosinophils Relative: 1 %
HCT: 36 % (ref 36.0–46.0)
Hemoglobin: 12.1 g/dL (ref 12.0–15.0)
Immature Granulocytes: 0 %
Lymphocytes Relative: 27 %
Lymphs Abs: 2.6 10*3/uL (ref 0.7–4.0)
MCH: 30.2 pg (ref 26.0–34.0)
MCHC: 33.6 g/dL (ref 30.0–36.0)
MCV: 89.8 fL (ref 80.0–100.0)
Monocytes Absolute: 0.6 10*3/uL (ref 0.1–1.0)
Monocytes Relative: 6 %
Neutro Abs: 6.6 10*3/uL (ref 1.7–7.7)
Neutrophils Relative %: 66 %
Platelets: 226 10*3/uL (ref 150–400)
RBC: 4.01 MIL/uL (ref 3.87–5.11)
RDW: 12.3 % (ref 11.5–15.5)
WBC: 10 10*3/uL (ref 4.0–10.5)
nRBC: 0 % (ref 0.0–0.2)

## 2020-10-06 LAB — WET PREP, GENITAL
Sperm: NONE SEEN
Trich, Wet Prep: NONE SEEN
Yeast Wet Prep HPF POC: NONE SEEN

## 2020-10-06 LAB — COMPREHENSIVE METABOLIC PANEL
ALT: 18 U/L (ref 0–44)
AST: 22 U/L (ref 15–41)
Albumin: 4 g/dL (ref 3.5–5.0)
Alkaline Phosphatase: 77 U/L (ref 38–126)
Anion gap: 5 (ref 5–15)
BUN: 5 mg/dL — ABNORMAL LOW (ref 6–20)
CO2: 24 mmol/L (ref 22–32)
Calcium: 9.1 mg/dL (ref 8.9–10.3)
Chloride: 106 mmol/L (ref 98–111)
Creatinine, Ser: 0.92 mg/dL (ref 0.44–1.00)
GFR, Estimated: >60 mL/min (ref >60)
Glucose, Bld: 95 mg/dL (ref 70–99)
Potassium: 3.9 mmol/L (ref 3.5–5.1)
Sodium: 135 mmol/L (ref 135–145)
Total Bilirubin: 0.8 mg/dL (ref 0.3–1.2)
Total Protein: 7.4 g/dL (ref 6.5–8.1)

## 2020-10-06 LAB — ABO/RH: ABO/RH(D): O POS

## 2020-10-06 LAB — GC/CHLAMYDIA PROBE AMP (~~LOC~~) NOT AT ARMC
Chlamydia: NEGATIVE
Comment: NEGATIVE
Comment: NORMAL
Neisseria Gonorrhea: NEGATIVE

## 2020-10-06 LAB — HCG, QUANTITATIVE, PREGNANCY: hCG, Beta Chain, Quant, S: 389 m[IU]/mL — ABNORMAL HIGH (ref <5)

## 2020-10-06 NOTE — MAU Provider Note (Signed)
History     CSN: [id number]  Arrival date and time: 10/05/20 2234   None     Chief Complaint  Patient presents with   Possible Pregnancy   Abdominal Pain   [name] is a 20 y.o. G1P0 at [redacted]w[redacted]d who presents to MAU for pelvic pain. Patient reports the pain started on Friday and was relatively minor, but then tonight the pain spiked to an 8/10. Patient reports the pain is almost resolved at this time and has resolved without treatment. Patient reports she came in because the pain got significantly worse than it was before, but states the pain is almost gone at this time.  Pt denies VB, vaginal discharge/odor/itching. Pt denies N/V, abdominal pain, constipation, diarrhea, or urinary problems. Pt denies fever, chills, fatigue, sweating or changes in appetite. Pt denies SOB or chest pain. Pt denies dizziness, HA, light-headedness, weakness.   OB History     Gravida  1   Para      Term      Preterm      AB      Living         SAB      IAB      Ectopic      Multiple      Live Births              Past Medical History:  Diagnosis Date   Asthma    Depression     Past Surgical History:  Procedure Laterality Date   FOOT SURGERY     WISDOM TOOTH EXTRACTION      Family History  Problem Relation Age of Onset   Heart failure Other    Cancer Maternal Aunt     Social History   Tobacco Use   Smoking status: Never   Smokeless tobacco: Never  Substance Use Topics   Alcohol use: No   Drug use: No    Allergies: No Known Allergies  Medications Prior to Admission  Medication Sig Dispense Refill Last Dose   albuterol (VENTOLIN HFA) 108 (90 Base) MCG/ACT inhaler Inhale 2 puffs into the lungs every 4 (four) hours as needed.   Past Month   acetaminophen (TYLENOL) 500 MG tablet Take 1 tablet (500 mg total) by mouth every 6 (six) hours as needed. 30 tablet 0 More than a month   amoxicillin (AMOXIL) 875 MG tablet Take 875 mg by mouth 2 (two) times daily.       aspirin 325 MG EC tablet TAKE 1 TABLET BY MOUTH EVERY DAY 30 tablet 0 More than a month   augmented betamethasone dipropionate (DIPROLENE-AF) 0.05 % ointment Apply twice daily to worst areas of eczema only. Switch to triamcinolone once improved      cefdinir (OMNICEF) 300 MG capsule Take 300 mg by mouth 2 (two) times daily.      cetirizine (ZYRTEC) 10 MG tablet Take 10 mg by mouth daily.      cetirizine HCl (ZYRTEC) 1 MG/ML solution Take by mouth.      DERMA-SMOOTHE/FS SCALP 0.01 % OIL APPLY TO SCALP EVERY OTHER DAY      docusate sodium (COLACE) 100 MG capsule Take 1 capsule (100 mg total) by mouth 2 (two) times daily. 10 capsule 0 More than a month   doxycycline (VIBRA-TABS) 100 MG tablet Take 100 mg by mouth 2 (two) times daily.      DUPIXENT 300 MG/2ML prefilled syringe       ergocalciferol (VITAMIN D2) 1.25 MG (50000  UT) capsule Take by mouth.      escitalopram (LEXAPRO) 10 MG tablet Take 1 tablet by mouth every morning.      fluconazole (DIFLUCAN) 150 MG tablet Take 150 mg by mouth once.      Fluocinolone Acetonide Body 0.01 % OIL APPLY TO SCALP EVERY OTHER DAY      fluocinonide ointment (LIDEX) 0.05 % Apply twice day to worst eczema until improved      fluticasone (FLONASE) 50 MCG/ACT nasal spray Place 1 spray into both nostrils daily.      hydrocortisone 2.5 % cream APPLY TO FACE 1 TO 2 TIMES A DAY      hydrOXYzine (ATARAX/VISTARIL) 25 MG tablet Take 25 mg by mouth 2 (two) times daily as needed.      ibuprofen (ADVIL) 800 MG tablet One tablet by mouth with food once daily in the morning 30 tablet 0    lidocaine (XYLOCAINE) 5 % ointment Apply 1 application topically as needed. For pain in the toes at bedtime 35.44 g 0    meloxicam (MOBIC) 7.5 MG tablet Take 1 tablet (7.5 mg total) by mouth daily. 30 tablet 0 Unknown   metroNIDAZOLE (FLAGYL) 500 MG tablet Take 500 mg by mouth 2 (two) times daily.      promethazine (PHENERGAN) 12.5 MG tablet Take 1 tablet (12.5 mg total) by mouth every  8 (eight) hours as needed for nausea or vomiting. 20 tablet 0 More than a month   sertraline (ZOLOFT) 50 MG tablet Take 1 tablet by mouth at bedtime.      SRONYX 0.1-20 MG-MCG tablet TAKE 1 TAB BY MOUTH DAILY START ON NOVEMBER 12      triamcinolone cream (KENALOG) 0.1 % Apply topically.      triamcinolone ointment (KENALOG) 0.1 % Apply neck down 1-2 times a day as needed       Review of Systems  Constitutional:  Negative for chills, diaphoresis, fatigue and fever.  Eyes:  Negative for visual disturbance.  Respiratory:  Negative for shortness of breath.   Cardiovascular:  Negative for chest pain.  Gastrointestinal:  Negative for abdominal pain, constipation, diarrhea, nausea and vomiting.  Genitourinary:  Positive for pelvic pain. Negative for dysuria, flank pain, frequency, urgency, vaginal bleeding and vaginal discharge.  Neurological:  Negative for dizziness, weakness, light-headedness and headaches.   Physical Exam   Blood pressure 125/67, pulse 88, temperature 99.5 F (37.5 C), temperature source Oral, resp. rate 17, height 5' 7.5" (1.715 m), weight 68.5 kg, last menstrual period 09/06/2020, SpO2 100 %.  Patient Vitals for the past 24 hrs:  BP Temp Temp src Pulse Resp SpO2 Height Weight  10/05/20 2258 125/67 99.5 F (37.5 C) Oral 88 17 100 % 5' 7.5" (1.715 m) 68.5 kg   Physical Exam Vitals and nursing note reviewed.  Constitutional:      General: She is not in acute distress.    Appearance: Normal appearance. She is not ill-appearing, toxic-appearing or diaphoretic.  HENT:     Head: Normocephalic and atraumatic.  Pulmonary:     Effort: Pulmonary effort is normal.  Neurological:     Mental Status: She is alert and oriented to person, place, and time.  Psychiatric:        Mood and Affect: Mood normal.        Behavior: Behavior normal.        Thought Content: Thought content normal.        Judgment: Judgment normal.   Results for orders placed or  performed during the  hospital encounter of 10/05/20 (from the past 24 hour(s))  Pregnancy, urine POC     Status: Abnormal   Collection Time: 10/05/20 11:02 PM  Result Value Ref Range   Preg Test, Ur POSITIVE (A) NEGATIVE  Urinalysis, Routine w reflex microscopic Urine, Clean Catch     Status: Abnormal   Collection Time: 10/05/20 11:06 PM  Result Value Ref Range   Color, Urine STRAW (A) YELLOW   APPearance CLEAR CLEAR   Specific Gravity, Urine 1.006 1.005 - 1.030   pH 6.0 5.0 - 8.0   Glucose, UA NEGATIVE NEGATIVE mg/dL   Hgb urine dipstick NEGATIVE NEGATIVE   Bilirubin Urine NEGATIVE NEGATIVE   Ketones, ur NEGATIVE NEGATIVE mg/dL   Protein, ur NEGATIVE NEGATIVE mg/dL   Nitrite NEGATIVE NEGATIVE   Leukocytes,Ua NEGATIVE NEGATIVE  Wet prep, genital     Status: Abnormal   Collection Time: 10/05/20 11:55 PM   Specimen: Vaginal  Result Value Ref Range   Yeast Wet Prep HPF POC NONE SEEN NONE SEEN   Trich, Wet Prep NONE SEEN NONE SEEN   Clue Cells Wet Prep HPF POC PRESENT (A) NONE SEEN   WBC, Wet Prep HPF POC MANY (A) NONE SEEN   Sperm NONE SEEN   CBC with Differential/Platelet     Status: None   Collection Time: 10/06/20 12:23 AM  Result Value Ref Range   WBC 10.0 4.0 - 10.5 K/uL   RBC 4.01 3.87 - 5.11 MIL/uL   Hemoglobin 12.1 12.0 - 15.0 g/dL   HCT 36.0 36.0 - 46.0 %   MCV 89.8 80.0 - 100.0 fL   MCH 30.2 26.0 - 34.0 pg   MCHC 33.6 30.0 - 36.0 g/dL   RDW 12.3 11.5 - 15.5 %   Platelets 226 150 - 400 K/uL   nRBC 0.0 0.0 - 0.2 %   Neutrophils Relative % 66 %   Neutro Abs 6.6 1.7 - 7.7 K/uL   Lymphocytes Relative 27 %   Lymphs Abs 2.6 0.7 - 4.0 K/uL   Monocytes Relative 6 %   Monocytes Absolute 0.6 0.1 - 1.0 K/uL   Eosinophils Relative 1 %   Eosinophils Absolute 0.1 0.0 - 0.5 K/uL   Basophils Relative 0 %   Basophils Absolute 0.0 0.0 - 0.1 K/uL   Immature Granulocytes 0 %   Abs Immature Granulocytes 0.02 0.00 - 0.07 K/uL  Comprehensive metabolic panel     Status: Abnormal   Collection Time:  10/06/20 12:23 AM  Result Value Ref Range   Sodium 135 135 - 145 mmol/L   Potassium 3.9 3.5 - 5.1 mmol/L   Chloride 106 98 - 111 mmol/L   CO2 24 22 - 32 mmol/L   Glucose, Bld 95 70 - 99 mg/dL   BUN 5 (L) 6 - 20 mg/dL   Creatinine, Ser 0.92 0.44 - 1.00 mg/dL   Calcium 9.1 8.9 - 10.3 mg/dL   Total Protein 7.4 6.5 - 8.1 g/dL   Albumin 4.0 3.5 - 5.0 g/dL   AST 22 15 - 41 U/L   ALT 18 0 - 44 U/L   Alkaline Phosphatase 77 38 - 126 U/L   Total Bilirubin 0.8 0.3 - 1.2 mg/dL   GFR, Estimated >60 >60 mL/min   Anion gap 5 5 - 15  hCG, quantitative, pregnancy     Status: Abnormal   Collection Time: 10/06/20 12:23 AM  Result Value Ref Range   hCG, Beta Chain, Quant, S 389 (H) <5 mIU/mL  ABO/Rh     Status: None   Collection Time: 10/06/20 12:23 AM  Result Value Ref Range   ABO/RH(D) O POS    No rh immune globuloin      NOT A RH IMMUNE GLOBULIN CANDIDATE, PT RH POSITIVE Performed at [facility] [address], [place], [place] [address]    US OB LESS THAN 14 WEEKS WITH OB TRANSVAGINAL  Result Date: 10/06/2020 CLINICAL DATA:  Pelvic pain on the left, positive pregnancy test EXAM: OBSTETRIC <14 WK [place] AND TRANSVAGINAL OB US TECHNIQUE: Both transabdominal and transvaginal ultrasound examinations were performed for complete evaluation of the gestation as well as the maternal uterus, adnexal regions, and pelvic cul-de-sac. Transvaginal technique was performed to assess early pregnancy. COMPARISON:  None. FINDINGS: Intrauterine gestational sac: Absent Maternal uterus/adnexae: Uterus is well visualized and within normal limits. Ovaries show a cystic changes on the right. Mild free fluid is noted. IMPRESSION: Mild thickening of the endometrium although no gestational sac is noted. Correlate with quantitative beta HCG level. Recommend follow-up quantitative B-HCG levels and follow-up US in 14-21 days to assess viability as necessary. This recommendation follows SRU consensus guidelines: Diagnostic  Criteria for Nonviable Pregnancy Early in the First Trimester. [place] Med 2013; 470:9628-36. Electronically Signed   By: Inez Catalina M.D.   On: 10/06/2020 00:37    MAU Course  Procedures  MDM -r/o ectopic -UA: straw, otherwise WNL -CBC: WNL -CMP: no abnormalities requiring treatment -[place]: PUL, mild thickening of endometrium; based on abnormal presentation of [place] images, especially when contrasted with [place] report, consulted with Dr. Kennon Rounds. Per Dr. Kennon Rounds, will repeat hCG in 48 hours -hCG: 25 -ABO: O Positive -WetPrep: +ClueCells (isolated finding not requiring treatment) -GC/CT collected -Discussed with client the diagnosis of pregnancy of unknown anatomic location.  Three possibilities of outcome are: a healthy pregnancy that is too early to see a yolk sac to confirm the pregnancy is in the uterus, a pregnancy that is not healthy and has not developed and will not develop, and an ectopic pregnancy that is in the abdomen that cannot be identified at this time.  And ectopic pregnancy can be a life threatening situation as a pregnancy needs to be in the uterus which is a muscle and can stretch to accommodate the growth of a pregnancy.  Other structures in the pelvis and abdomen as not muscular and do not stretch with the growth of a pregnancy.  Worst case scenario is that a structure ruptures with a growing pregnancy not in the uterus and and internal hemorrhage can be a life threatening situation.  We need to follow the progression of this pregnancy carefully.  We need to check another serum pregnancy hormone level to determine if the levels are rising appropriately  and to determine the next steps that are needed for you. Patient's questions were answered. -pt discharged to home in stable condition  Orders Placed This Encounter  Procedures   Wet prep, genital    Standing Status:   Standing    Number of Occurrences:   1   US OB LESS THAN 14 WEEKS WITH OB TRANSVAGINAL    Standing Status:    Standing    Number of Occurrences:   1    Order Specific Question:   Symptom/Reason for Exam    Answer:   Pelvic cramping in antepartum period [[id number]]   Urinalysis, Routine w reflex microscopic Urine, Clean Catch    Standing Status:   Standing  Number of Occurrences:   1   CBC with Differential/Platelet    Standing Status:   Standing    Number of Occurrences:   1   Comprehensive metabolic panel    Standing Status:   Standing    Number of Occurrences:   1   hCG, quantitative, pregnancy    Standing Status:   Standing    Number of Occurrences:   1   Pregnancy, urine POC    Standing Status:   Standing    Number of Occurrences:   1   ABO/Rh    Standing Status:   Standing    Number of Occurrences:   1   Discharge patient    Order Specific Question:   Discharge disposition    Answer:   01-Home or Self Care [1]    Order Specific Question:   Discharge patient date    Answer:   10/06/2020   No orders of the defined types were placed in this encounter.  Assessment and Plan   1. Pregnancy of unknown anatomic location   2. Pelvic cramping in antepartum period   3. Blood type, Rh positive   4. [redacted] weeks gestation of pregnancy    Allergies as of 10/06/2020   No Known Allergies      Medication List     TAKE these medications    acetaminophen 500 MG tablet Commonly known as: TYLENOL Take 1 tablet (500 mg total) by mouth every 6 (six) hours as needed.   albuterol 108 (90 Base) MCG/ACT inhaler Commonly known as: VENTOLIN HFA Inhale 2 puffs into the lungs every 4 (four) hours as needed.   amoxicillin 875 MG tablet Commonly known as: AMOXIL Take 875 mg by mouth 2 (two) times daily.   aspirin 325 MG EC tablet TAKE 1 TABLET BY MOUTH EVERY DAY   augmented betamethasone dipropionate 0.05 % ointment Commonly known as: DIPROLENE-AF Apply twice daily to worst areas of eczema only. Switch to triamcinolone once improved   cefdinir 300 MG capsule Commonly known as: OMNICEF Take  300 mg by mouth 2 (two) times daily.   cetirizine 10 MG tablet Commonly known as: ZYRTEC Take 10 mg by mouth daily.   cetirizine HCl 1 MG/ML solution Commonly known as: ZYRTEC Take by mouth.   docusate sodium 100 MG capsule Commonly known as: Colace Take 1 capsule (100 mg total) by mouth 2 (two) times daily.   doxycycline 100 MG tablet Commonly known as: VIBRA-TABS Take 100 mg by mouth 2 (two) times daily.   Dupixent 300 MG/2ML prefilled syringe Generic drug: dupilumab   ergocalciferol 1.25 MG (50000 UT) capsule Commonly known as: VITAMIN D2 Take by mouth.   escitalopram 10 MG tablet Commonly known as: LEXAPRO Take 1 tablet by mouth every morning.   fluconazole 150 MG tablet Commonly known as: DIFLUCAN Take 150 mg by mouth once.   Fluocinolone Acetonide Body 0.01 % Oil APPLY TO SCALP EVERY OTHER DAY   Derma-Smoothe/FS Scalp 0.01 % Oil Generic drug: Fluocinolone Acetonide Scalp APPLY TO SCALP EVERY OTHER DAY   fluocinonide ointment 0.05 % Commonly known as: LIDEX Apply twice day to worst eczema until improved   fluticasone 50 MCG/ACT nasal spray Commonly known as: FLONASE Place 1 spray into both nostrils daily.   hydrocortisone 2.5 % cream APPLY TO FACE 1 TO 2 TIMES A DAY   hydrOXYzine 25 MG tablet Commonly known as: ATARAX/VISTARIL Take 25 mg by mouth 2 (two) times daily as needed.   ibuprofen 800 MG  tablet Commonly known as: ADVIL One tablet by mouth with food once daily in the morning   lidocaine 5 % ointment Commonly known as: XYLOCAINE Apply 1 application topically as needed. For pain in the toes at bedtime   meloxicam 7.5 MG tablet Commonly known as: MOBIC Take 1 tablet (7.5 mg total) by mouth daily.   metroNIDAZOLE 500 MG tablet Commonly known as: FLAGYL Take 500 mg by mouth 2 (two) times daily.   promethazine 12.5 MG tablet Commonly known as: PHENERGAN Take 1 tablet (12.5 mg total) by mouth every 8 (eight) hours as needed for nausea or  vomiting.   sertraline 50 MG tablet Commonly known as: ZOLOFT Take 1 tablet by mouth at bedtime.   Sronyx 0.1-20 MG-MCG tablet Generic drug: levonorgestrel-ethinyl estradiol TAKE 1 TAB BY MOUTH DAILY START ON NOVEMBER 12   triamcinolone cream 0.1 % Commonly known as: KENALOG Apply topically.   triamcinolone ointment 0.1 % Commonly known as: KENALOG Apply neck down 1-2 times a day as needed       -will call with culture results, if positive -safe meds in pregnancy list given -list of OB providers given -discussed ectopic vs. First trimester vs. miscarriage -strict ectopic precautions given -return MAU precautions -f/u on 10/08/2020 at [facility] for repeat hCG -pt discharged to home in stable condition  Elmyra Ricks E Samrat Hayward 10/06/2020, 1:35 AM

## 2020-10-06 NOTE — Discharge Instructions (Addendum)
Safe Medications in Pregnancy    Acne: Benzoyl Peroxide Salicylic Acid  Backache/Headache: Tylenol: 2 regular strength every 4 hours OR              2 Extra strength every 6 hours  Colds/Coughs/Allergies: Benadryl (alcohol free) 25 mg every 6 hours as needed Breath right strips Claritin Cepacol throat lozenges Chloraseptic throat spray Cold-Eeze- up to three times per day Cough drops, alcohol free Flonase (by prescription only) Guaifenesin Mucinex Robitussin DM (plain only, alcohol free) Saline nasal spray/drops Sudafed (pseudoephedrine) & Actifed ** use only after [redacted] weeks gestation and if you do not have high blood pressure Tylenol Vicks Vaporub Zinc lozenges Zyrtec   Constipation: Colace Ducolax suppositories Fleet enema Glycerin suppositories Metamucil Milk of magnesia Miralax Senokot Smooth move tea  Diarrhea: Kaopectate Imodium A-D  *NO pepto Bismol  Hemorrhoids: Anusol Anusol HC Preparation H Tucks  Indigestion: Tums Maalox Mylanta Zantac  Pepcid  Insomnia: Benadryl (alcohol free) 25mg every 6 hours as needed Tylenol PM Unisom, no Gelcaps  Leg Cramps: Tums MagGel  Nausea/Vomiting:  Bonine Dramamine Emetrol Ginger extract Sea bands Meclizine  Nausea medication to take during pregnancy:  Unisom (doxylamine succinate 25 mg tablets) Take one tablet daily at bedtime. If symptoms are not adequately controlled, the dose can be increased to a maximum recommended dose of two tablets daily (1/2 tablet in the morning, 1/2 tablet mid-afternoon and one at bedtime). Vitamin B6 100mg tablets. Take one tablet twice a day (up to 200 mg per day).  Skin Rashes: Aveeno products Benadryl cream or 25mg every 6 hours as needed Calamine Lotion 1% cortisone cream  Yeast infection: Gyne-lotrimin 7 Monistat 7   **If taking multiple medications, please check labels to avoid duplicating the same active ingredients **take  medication as directed on the label ** Do not exceed 4000 mg of tylenol in 24 hours **Do not take medications that contain aspirin or ibuprofen         Prenatal Care Providers           [facility] - accepts patients without insurance  Phone: [phone]  [facility] @ [facility]   Phone: [phone]  [facility] @[place] [place]       Phone: [phone]            [facility] @ [facility]     Phone: [phone]          [facility] @ [place]   Phone: [phone]  [facility] @ [facility] - accepts patients without insurance  Phone: [phone]  [facility] @ [facility] Phone: [phone]     [facility] - accepts patients without insurance Phone: [phone]  [facility]  Phone: [phone]  [facility] Phone: [phone]  [facility] Phone: [phone]  [facility] Phone: [phone]  [facility] Phone: [phone]  [facility]  Phone: [phone]  

## 2020-10-08 ENCOUNTER — Ambulatory Visit (INDEPENDENT_AMBULATORY_CARE_PROVIDER_SITE_OTHER): Payer: Medicaid Other

## 2020-10-08 ENCOUNTER — Other Ambulatory Visit: Payer: Self-pay

## 2020-10-08 VITALS — BP 116/78 | HR 84 | Wt 153.6 lb

## 2020-10-08 DIAGNOSIS — Z3490 Encounter for supervision of normal pregnancy, unspecified, unspecified trimester: Secondary | ICD-10-CM

## 2020-10-08 DIAGNOSIS — O3680X0 Pregnancy with inconclusive fetal viability, not applicable or unspecified: Secondary | ICD-10-CM

## 2020-10-08 LAB — BETA HCG QUANT (REF LAB): hCG Quant: 948 m[IU]/mL

## 2020-10-08 NOTE — Progress Notes (Signed)
Beta HCG Follow-up Visit  Kendra Barajas presents to [facility] for follow-up beta HCG lab. She was seen in MAU for abdominal pain on 10/06/20. Patient denies pain or bleeding today. Discussed with patient that we are following beta HCG levels today. Results will be back in approximately 2 hours. Valid contact number for patient confirmed. I will call the patient with results.   Beta HCG results: 10/06/20 389  10/08/20 948      Results and patient history reviewed with Stinson, DO, who states this is an appropriate rise in HCG and recommends pt return for [place] in 2 weeks. Patient called and informed of plan for follow-up. [place] scheduled for 10/22/20 at 9 AM. MyChart message sent to patient with details.  [name] 10/08/2020 9:19 AM

## 2020-10-09 ENCOUNTER — Ambulatory Visit (INDEPENDENT_AMBULATORY_CARE_PROVIDER_SITE_OTHER): Payer: Medicaid Other

## 2020-10-09 ENCOUNTER — Ambulatory Visit (INDEPENDENT_AMBULATORY_CARE_PROVIDER_SITE_OTHER): Payer: Medicaid Other | Admitting: Sports Medicine

## 2020-10-09 DIAGNOSIS — M2041 Other hammer toe(s) (acquired), right foot: Secondary | ICD-10-CM

## 2020-10-09 DIAGNOSIS — Z9889 Other specified postprocedural states: Secondary | ICD-10-CM

## 2020-10-09 DIAGNOSIS — L905 Scar conditions and fibrosis of skin: Secondary | ICD-10-CM

## 2020-10-09 DIAGNOSIS — M79671 Pain in right foot: Secondary | ICD-10-CM

## 2020-10-09 NOTE — Progress Notes (Signed)
Subjective: Kendra Barajas is a 20 y.o. female patient seen today in office for POV #4 (DOS 06/30/2020), S/P removal of bone and release of tenderness at the right fourth toe and fifth toe.  Patient states that her toes are doing good. Denies constitutional symptoms.  Patient Active Problem List   Diagnosis Date Noted   Vitamin D deficiency 01/22/2020   Atopic dermatitis 11/23/2017    Current Outpatient Medications on File Prior to Visit  Medication Sig Dispense Refill   acetaminophen (TYLENOL) 500 MG tablet Take 1 tablet (500 mg total) by mouth every 6 (six) hours as needed. 30 tablet 0   albuterol (VENTOLIN HFA) 108 (90 Base) MCG/ACT inhaler Inhale 2 puffs into the lungs every 4 (four) hours as needed.     cetirizine (ZYRTEC) 10 MG tablet Take 10 mg by mouth daily.     DERMA-SMOOTHE/FS SCALP 0.01 % OIL      Fluocinolone Acetonide Body 0.01 % OIL      fluocinonide ointment (LIDEX) 0.05 %      fluticasone (FLONASE) 50 MCG/ACT nasal spray Place 1 spray into both nostrils daily.     hydrocortisone 2.5 % cream      triamcinolone ointment (KENALOG) 0.1 %      No current facility-administered medications on file prior to visit.    No Known Allergies  Objective: There were no vitals filed for this visit.  General: No acute distress, AAOx3  Right foot: Incision well healed no gapping or dehiscence at surgical sites, minimal swelling to right fourth and fifth toes, no erythema, no warmth, no drainage, no signs of infection noted, Capillary fill time <3 seconds in all digits, gross sensation present via light touch to right foot.  No guarding with range of motion to right foot.  Assessment and Plan:  Problem List Items Addressed This Visit   None Visit Diagnoses     Hammertoe of right foot    -  Primary   Relevant Orders   DG Foot Complete Right (Completed)   Pain in right foot       Status post right foot surgery       Scar tissue            -Patient seen and evaluated -X-rays  reviewed and are normal for post op status -Recommend range of motion and scar care -Continue with good supportive shoes  -Return PRN.  Landis Martins, DPM

## 2020-10-10 ENCOUNTER — Telehealth: Payer: Self-pay | Admitting: *Deleted

## 2020-10-10 NOTE — Telephone Encounter (Signed)
Received voicemail message from female who states she is Kendra Barajas, Mother of Yarexi. States she is calling to see if we are going to order PNV and asks for a call back. Elisabeth Strom,RN

## 2020-10-13 MED ORDER — PREPLUS 27-1 MG PO TABS: 1.0000 | ORAL_TABLET | Freq: Every day | ORAL | 8 refills | Status: DC

## 2020-10-13 NOTE — Telephone Encounter (Signed)
Call placed back to pt's mother. Pt identified with name and DOB. Pt's mother states needing Rx for PNV for daughter Kendra Barajas.  Rx PNV sent to pharmacy on file. Pt advised to start taking 1 tab daily. Mother verbalized understanding.   Colletta Maryland, RN

## 2020-10-22 ENCOUNTER — Ambulatory Visit (INDEPENDENT_AMBULATORY_CARE_PROVIDER_SITE_OTHER): Payer: Medicaid Other | Admitting: General Practice

## 2020-10-22 ENCOUNTER — Other Ambulatory Visit: Payer: Self-pay

## 2020-10-22 ENCOUNTER — Ambulatory Visit
Admission: RE | Admit: 2020-10-22 | Discharge: 2020-10-22 | Disposition: A | Discharge status: Home / Self Care | Payer: Medicaid Other | Source: Ambulatory Visit | Attending: Family Medicine | Admitting: Family Medicine

## 2020-10-22 VITALS — BP 127/75 | HR 77 | Ht 67.0 in | Wt 152.0 lb

## 2020-10-22 DIAGNOSIS — O3680X0 Pregnancy with inconclusive fetal viability, not applicable or unspecified: Secondary | ICD-10-CM | POA: Insufficient documentation

## 2020-10-22 DIAGNOSIS — O3680X2 Pregnancy with inconclusive fetal viability, fetus 2: Secondary | ICD-10-CM

## 2020-10-22 DIAGNOSIS — O219 Vomiting of pregnancy, unspecified: Secondary | ICD-10-CM

## 2020-10-22 MED ORDER — PROMETHAZINE HCL 25 MG PO TABS: 25.0000 mg | ORAL_TABLET | Freq: Four times a day (QID) | ORAL | 0 refills | Status: DC | PRN

## 2020-10-22 NOTE — Progress Notes (Signed)
Patient presents to office today for ultrasound results. Ultrasound reviewed with Dr Dione Plover who finds IUP x2 with one fetal pole visualized- patient should have follow up ultrasound in 2 weeks. Scheduled ultrasound for 8/9 @ 10am.  Patient informed of results and future ultrasound appt. Patient verbalized understanding & reports continued nausea. Phenergan Rx sent to pharmacy per protocol & patient informed. Patient will follow up 8/9 after ultrasound.  Koren Bound RN BSN 10/22/20

## 2020-10-29 NOTE — Progress Notes (Signed)
Chart reviewed for nurse visit. Agree with plan of care.   Clarnce Flock, MD 10/29/20 7:41 AM

## 2020-11-01 ENCOUNTER — Inpatient Hospital Stay (HOSPITAL_COMMUNITY)
Admission: AD | Admit: 2020-11-01 | Discharge: 2020-11-01 | Disposition: A | Discharge status: Home / Self Care | Payer: Medicaid Other | Attending: Obstetrics & Gynecology | Admitting: Obstetrics & Gynecology

## 2020-11-01 ENCOUNTER — Other Ambulatory Visit: Payer: Self-pay

## 2020-11-01 DIAGNOSIS — O219 Vomiting of pregnancy, unspecified: Secondary | ICD-10-CM

## 2020-11-01 DIAGNOSIS — Z3A08 8 weeks gestation of pregnancy: Secondary | ICD-10-CM | POA: Diagnosis not present

## 2020-11-01 DIAGNOSIS — O210 Mild hyperemesis gravidarum: Secondary | ICD-10-CM

## 2020-11-01 LAB — URINALYSIS, ROUTINE W REFLEX MICROSCOPIC
Bilirubin Urine: NEGATIVE
Glucose, UA: NEGATIVE mg/dL
Hgb urine dipstick: NEGATIVE
Ketones, ur: 20 mg/dL — AB
Leukocytes,Ua: NEGATIVE
Nitrite: NEGATIVE
Protein, ur: 30 mg/dL — AB
Specific Gravity, Urine: 1.026 (ref 1.005–1.030)
pH: 5 (ref 5.0–8.0)

## 2020-11-01 MED ORDER — ONDANSETRON 8 MG PO TBDP: 8.0000 mg | ORAL_TABLET | Freq: Three times a day (TID) | ORAL | 0 refills | Status: DC | PRN

## 2020-11-01 MED ORDER — ONDANSETRON 4 MG PO TBDP
8.0000 mg | ORAL_TABLET | Freq: Once | ORAL | Status: AC
  Administered 2020-11-01: 8 mg via ORAL
  Filled 2020-11-01: qty 2

## 2020-11-01 MED ORDER — SENNA 8.6 MG PO TABS: 1.0000 | ORAL_TABLET | Freq: Every day | ORAL | 0 refills | Status: DC

## 2020-11-01 NOTE — MAU Provider Note (Signed)
Patient Kendra Barajas is a 20 y.o. G1P0  At 19w0dhere with complaints of nausea and vomiting that has been on-going but got worse today. She denies vaginal bleeding, abnormal discharge, pelvic pain, burning with urination. She reports that she was given phenergan but it is not working. She 'keeps throwing it up".    History     CSN: [id number] Arrival date and time: 11/01/20 [id number]  Event Date/Time   First Provider Initiated Contact with Patient 11/01/20 2052      Chief Complaint  Patient presents with   Emesis   Nausea   Emesis  This is a new problem. The current episode started in the past 7 days. The problem occurs 5 to 10 times per day. The problem has been gradually worsening. The emesis has an appearance of bile and stomach contents. There has been no fever. Pertinent negatives include no chest pain, chills, coughing, dizziness or fever.  She was given phenergan but it did not work and it kept coming back up. Today she tried to eat fruit, noodles, and waffle. She tried drinking water and juice but it all came back up.   OB History     Gravida  1   Para      Term      Preterm      AB      Living         SAB      IAB      Ectopic      Multiple      Live Births              Past Medical History:  Diagnosis Date   Asthma    Depression     Past Surgical History:  Procedure Laterality Date   FOOT SURGERY     WISDOM TOOTH EXTRACTION      Family History  Problem Relation Age of Onset   Heart failure Other    Cancer Maternal Aunt     Social History   Tobacco Use   Smoking status: Never   Smokeless tobacco: Never  Substance Use Topics   Alcohol use: No   Drug use: No    Allergies: No Known Allergies  Medications Prior to Admission  Medication Sig Dispense Refill Last Dose   acetaminophen (TYLENOL) 500 MG tablet Take 1 tablet (500 mg total) by mouth every 6 (six) hours as needed. 30 tablet 0    albuterol (VENTOLIN HFA) 108 (90 Base)  MCG/ACT inhaler Inhale 2 puffs into the lungs every 4 (four) hours as needed.      DERMA-SMOOTHE/FS SCALP 0.01 % OIL       Fluocinolone Acetonide Body 0.01 % OIL       fluocinonide ointment (LIDEX) 0.05 %       hydrocortisone 2.5 % cream       Prenatal Vit-Fe Fumarate-FA (PREPLUS) 27-1 MG TABS Take 1 tablet by mouth daily. 30 tablet 8    promethazine (PHENERGAN) 25 MG tablet Take 1 tablet (25 mg total) by mouth every 6 (six) hours as needed for nausea or vomiting. 30 tablet 0    triamcinolone ointment (KENALOG) 0.1 %        Review of Systems  Constitutional:  Negative for chills and fever.  Respiratory:  Negative for cough.   Cardiovascular:  Negative for chest pain.  Gastrointestinal:  Positive for vomiting.  Neurological:  Negative for dizziness.  Physical Exam   Blood pressure 113/67, pulse (!) 107,  temperature 99.1 F (37.3 C), temperature source Oral, resp. rate 17, height '5\' 7"'$  (1.702 m), weight 66.9 kg, last menstrual period 09/06/2020, SpO2 100 %.  Physical Exam Constitutional:      Appearance: Normal appearance.  Pulmonary:     Effort: Pulmonary effort is normal.  Abdominal:     General: Abdomen is flat.  Musculoskeletal:        General: Normal range of motion.  Skin:    General: Skin is warm and dry.  Neurological:     Mental Status: She is alert.    MAU Course  Procedures  MDM -UA shows no ketones; no blood work was done -Patient had Zofran and tolerated PO challenge -no other work-up performed  Assessment and Plan   1. Morning sickness    -patient stable for discharge home with RX for zofran and stool softener; D/C phenergan -instructions given on how diet and how to manage -reviewed warning signs and when to return to MAU  -all questions answered   [place] 11/01/2020, 8:52 PM

## 2020-11-01 NOTE — MAU Note (Signed)
..  Kendra Barajas is a 20 y.o. at 105w0dhere in MAU reporting: nausea and vomiting that has gotten worse today, she vomited 6 times in the past 24 hours and attempted to take her nausea medicine but she threw it up. Denies pain.  Pain score: 0/10 Vitals:   11/01/20 2024 11/01/20 2025  BP:  113/67  Pulse:  (!) 107  Resp:  17  Temp:  99.1 F (37.3 C)  SpO2: 100%      Lab orders placed from triage: UA

## 2020-11-04 ENCOUNTER — Ambulatory Visit
Admission: RE | Admit: 2020-11-04 | Discharge: 2020-11-04 | Disposition: A | Discharge status: Home / Self Care | Payer: Medicaid Other | Source: Ambulatory Visit | Attending: Family Medicine | Admitting: Family Medicine

## 2020-11-04 ENCOUNTER — Other Ambulatory Visit: Payer: Self-pay

## 2020-11-04 ENCOUNTER — Ambulatory Visit (INDEPENDENT_AMBULATORY_CARE_PROVIDER_SITE_OTHER): Payer: Medicaid Other | Admitting: Nurse Practitioner

## 2020-11-04 VITALS — BP 122/76 | HR 78 | Ht 67.5 in | Wt 144.5 lb

## 2020-11-04 DIAGNOSIS — O3680X2 Pregnancy with inconclusive fetal viability, fetus 2: Secondary | ICD-10-CM | POA: Diagnosis present

## 2020-11-04 DIAGNOSIS — O30041 Twin pregnancy, dichorionic/diamniotic, first trimester: Secondary | ICD-10-CM | POA: Diagnosis not present

## 2020-11-04 DIAGNOSIS — O219 Vomiting of pregnancy, unspecified: Secondary | ICD-10-CM

## 2020-11-04 MED ORDER — DOXYLAMINE-PYRIDOXINE 10-10 MG PO TBEC: DELAYED_RELEASE_TABLET | ORAL | 2 refills | Status: DC

## 2020-11-04 NOTE — Progress Notes (Signed)
History:  [name] is a 20 y.o. G1P0 who presents to clinic today with complaint of  follow up from ultrasound today with a di-di twin gestation but with only one sac that has an embryo .  Also she has vomiting and has been taking Zofran ODT that is working well but she is also having constipation.  She has medicine for constipation but she has not been taking it and has experienced constipation.  Had an ultrasound today and is here for results.  Has had no vaginal bleeding or lower abdominal pain.   Past Medical History:  Diagnosis Date   Asthma    Depression     Past Surgical History:  Procedure Laterality Date   FOOT SURGERY     WISDOM TOOTH EXTRACTION      The following portions of the patient's history were reviewed and updated as appropriate: allergies, current medications, past family history, past medical history, past social history, past surgical history and problem list.   Review of Systems:  Pertinent items noted in HPI and remainder of comprehensive ROS otherwise negative.  Objective:  Physical Exam BP 122/76   Pulse 78   Ht 5' 7.5" (1.715 m)   Wt 144 lb 8 oz (65.5 kg)   LMP 09/06/2020   BMI 22.30 kg/m  Physical Exam GENERAL: Well-developed, well-nourished female in no acute distress.  HEENT: Normocephalic, atraumatic.  LUNGS: Effort normal  HEART: Regular rate  SKIN: Warm, dry and without erythema  PSYCH: Normal mood and affect   Labs and Imaging No results found for this or any previous visit (from the past 24 hour(s)).  US OB Transvaginal  Result Date: 11/04/2020 CLINICAL DATA:  Assess viability EXAM: US OB TRANSVAGINAL TECHNIQUE: Transvaginal ultrasound was performed for complete evaluation of the gestation as well as the maternal uterus, adnexal regions, and pelvic cul-de-sac. COMPARISON:  None. FINDINGS: Number of IUPs:  2 Chorionicity/Amnionicity:  Dichorionic-diamniotic (thick membrane) TWIN 1 Yolk sac:  Visualized. Embryo:  Visualized.  Cardiac Activity: Visualized. Heart Rate: 186 bpm CRL:  20.45 mm   8 w 4 d                  [place] EDC: 06/12/2020 TWIN 2 Yolk sac:  No Embryo:  No MSD: 6.31 mm   5 w   2 d Subchorionic hemorrhage:  None visualized. Maternal uterus/adnexae: Subchorionic hemorrhage: None Right ovary: Normal Left ovary: Normal Other :None Free fluid:  None IMPRESSION: 1. Again seen are 2 intrauterine fluid collections. The larger fluid collection has the typical appearance of a gestational sac and contains a yolk sac and embryo. Cardiac activity is identified. The estimated gestational age is 66 weeks and 4 days. This is concordant with the clinical gestational age by last menstrual. 2. The smaller intrauterine fluid collection has a mean diameter of 6.31 mm which corresponds to a 5 week 2 day gestation. This is not changed from 10/22/2020. As noted previously there is no yolk sac or embryo identified. Assuming twin gestations these findings are concerning for non-viability of Twin gestation 2. Electronically Signed   By: Kerby Moors M.D.   On: 11/04/2020 11:04     Assessment & Plan:  1. Nausea and vomiting during pregnancy Reviewed Diclegis as an alternative treatment to try as it does not cause constipation.  Prescription sent. Her vomiting has been better with Zofran.  2. Dichorionic diamniotic twin pregnancy in first trimester Ultrasound reviewed and discussed with client and her partner. Only one sac has  embryo - FHT found and to expect one baby with this pregnancy - patient was relieved. Reviewed with client that second sac will be absorbed. Reviewed risk of miscarriage with any pregnancy - this pregnancy is not at higher risk. Will make an appointment today to begin prenatal care in this office in approx 2 weeks or first available for NOB appt. First trimester info placed in AVS today. Reviewed counseling with Dr. Dione Plover.    Approximately 15 minutes of total time was spent with this patient on history taking,  coordination of care, education and documentation.   Kendra Rochester, NP 11/04/2020 11:48 AM

## 2020-11-04 NOTE — Progress Notes (Signed)
Here for [place] results. Placed in provider room for Dr.Eckstat to talk with patient. Yacine Garriga,RN

## 2020-11-18 ENCOUNTER — Encounter: Payer: Self-pay | Admitting: Certified Nurse Midwife

## 2020-11-18 ENCOUNTER — Ambulatory Visit (INDEPENDENT_AMBULATORY_CARE_PROVIDER_SITE_OTHER): Payer: Medicaid Other | Admitting: Certified Nurse Midwife

## 2020-11-18 ENCOUNTER — Other Ambulatory Visit: Payer: Self-pay

## 2020-11-18 VITALS — BP 123/79 | HR 81 | Wt 141.4 lb

## 2020-11-18 DIAGNOSIS — Z3490 Encounter for supervision of normal pregnancy, unspecified, unspecified trimester: Secondary | ICD-10-CM

## 2020-11-18 DIAGNOSIS — Z3400 Encounter for supervision of normal first pregnancy, unspecified trimester: Secondary | ICD-10-CM | POA: Insufficient documentation

## 2020-11-18 DIAGNOSIS — Z3480 Encounter for supervision of other normal pregnancy, unspecified trimester: Secondary | ICD-10-CM | POA: Insufficient documentation

## 2020-11-18 DIAGNOSIS — O99511 Diseases of the respiratory system complicating pregnancy, first trimester: Secondary | ICD-10-CM

## 2020-11-18 DIAGNOSIS — Z3A10 10 weeks gestation of pregnancy: Secondary | ICD-10-CM

## 2020-11-18 DIAGNOSIS — J45909 Unspecified asthma, uncomplicated: Secondary | ICD-10-CM

## 2020-11-18 MED ORDER — GOJJI WEIGHT SCALE MISC: 1.0000 | 0 refills | Status: DC | PRN

## 2020-11-18 MED ORDER — BLOOD PRESSURE KIT DEVI: 1.0000 | 0 refills | Status: DC | PRN

## 2020-11-18 MED ORDER — ALBUTEROL SULFATE HFA 108 (90 BASE) MCG/ACT IN AERS
2.0000 | INHALATION_SPRAY | RESPIRATORY_TRACT | 5 refills | Status: DC | PRN
Start: 2020-11-18 — End: 2021-03-13

## 2020-11-19 ENCOUNTER — Encounter: Payer: Self-pay | Admitting: *Deleted

## 2020-11-19 LAB — CBC/D/PLT+RPR+RH+ABO+RUBIGG...
Antibody Screen: NEGATIVE
Basophils Absolute: 0 10*3/uL (ref 0.0–0.2)
Basos: 1 %
EOS (ABSOLUTE): 0.1 10*3/uL (ref 0.0–0.4)
Eos: 1 %
HCV Ab: <0.1 s/co ratio (ref 0.0–0.9)
HIV Screen 4th Generation wRfx: NONREACTIVE
Hematocrit: 34.3 % (ref 34.0–46.6)
Hemoglobin: 11.4 g/dL (ref 11.1–15.9)
Hepatitis B Surface Ag: NEGATIVE
Immature Grans (Abs): 0 10*3/uL (ref 0.0–0.1)
Immature Granulocytes: 0 %
Lymphocytes Absolute: 2.2 10*3/uL (ref 0.7–3.1)
Lymphs: 27 %
MCH: 29.3 pg (ref 26.6–33.0)
MCHC: 33.2 g/dL (ref 31.5–35.7)
MCV: 88 fL (ref 79–97)
Monocytes Absolute: 0.4 10*3/uL (ref 0.1–0.9)
Monocytes: 6 %
Neutrophils Absolute: 5.3 10*3/uL (ref 1.4–7.0)
Neutrophils: 65 %
Platelets: 224 10*3/uL (ref 150–450)
RBC: 3.89 x10E6/uL (ref 3.77–5.28)
RDW: 12.2 % (ref 11.7–15.4)
RPR Ser Ql: NONREACTIVE
Rh Factor: POSITIVE
Rubella Antibodies, IGG: 10.9 index (ref >0.99)
WBC: 8.1 10*3/uL (ref 3.4–10.8)

## 2020-11-19 LAB — HCV INTERPRETATION

## 2020-11-19 LAB — HEMOGLOBIN A1C
Est. average glucose Bld gHb Est-mCnc: 97 mg/dL
Hgb A1c MFr Bld: 5 % (ref 4.8–5.6)

## 2020-11-19 NOTE — Progress Notes (Signed)
History:   Keshana Klemz is a 20 y.o. G1P0000 at [redacted]w[redacted]d by LMP being seen today for her first obstetrical visit.  Her obstetrical history is not significant. Patient intends to breast and bottle feed. Pregnancy history fully reviewed.  Patient reports no complaints. Recently lost a close family member so is experiencing a lot of social issues. Spent an extended amount of time discussing resources with Prenatal Navigator Lattie Haw).    HISTORY: OB History  Gravida Para Term Preterm AB Living  1 0 0 0 0 0  SAB IAB Ectopic Multiple Live Births  0 0 0 0 0    # Outcome Date GA Lbr Len/2nd Weight Sex Delivery Anes PTL Lv  1 Current             No pap smear indicated due to age  Past Medical History:  Diagnosis Date   Anxiety    Asthma    Depression    Past Surgical History:  Procedure Laterality Date   Contracted toe Right 08/2019   FOOT SURGERY     WISDOM TOOTH EXTRACTION     Family History  Problem Relation Age of Onset   High blood pressure Mother    Anxiety disorder Mother    Schizophrenia Mother    Diabetes Father    Liver disease Father    Asthma Sister    Cancer Maternal Aunt    Heart failure Other    Social History   Tobacco Use   Smoking status: Never   Smokeless tobacco: Never  Substance Use Topics   Alcohol use: No   Drug use: No   No Known Allergies Current Outpatient Medications on File Prior to Visit  Medication Sig Dispense Refill   acetaminophen (TYLENOL) 500 MG tablet Take 1 tablet (500 mg total) by mouth every 6 (six) hours as needed. 30 tablet 0   DERMA-SMOOTHE/FS SCALP 0.01 % OIL      Doxylamine-Pyridoxine (DICLEGIS) 10-10 MG TBEC Take 2 tablets at bedtime and one in the morning and one in the afternoon as needed for nausea. 60 tablet 2   Fluocinolone Acetonide Body 0.01 % OIL      fluocinonide ointment (LIDEX) 0.05 %      hydrocortisone 2.5 % cream      Prenatal Vit-Fe Fumarate-FA (PREPLUS) 27-1 MG TABS Take 1 tablet by mouth daily. 30 tablet 8    triamcinolone ointment (KENALOG) 0.1 %      ondansetron (ZOFRAN-ODT) 8 MG disintegrating tablet Take 1 tablet (8 mg total) by mouth every 8 (eight) hours as needed for nausea or vomiting. (Patient not taking: Reported on 11/18/2020) 60 tablet 0   senna (SENOKOT) 8.6 MG TABS tablet Take 1 tablet (8.6 mg total) by mouth daily. (Patient not taking: Reported on 11/18/2020) 120 tablet 0   No current facility-administered medications on file prior to visit.    Review of Systems Pertinent items noted in HPI and remainder of comprehensive ROS otherwise negative. Physical Exam:   Vitals:   11/18/20 1534  BP: 123/79  Pulse: 81  Weight: 141 lb 6.4 oz (64.1 kg)   Bedside Ultrasound for FHR check: Viable intrauterine pregnancy with positive cardiac activity note, fetal heart rate 130s  Patient informed that the ultrasound is considered a limited obstetric ultrasound and is not intended to be a complete ultrasound exam.  Patient also informed that the ultrasound is not being completed with the intent of assessing for fetal or placental anomalies or any pelvic abnormalities.  Explained that the  purpose of today's ultrasound is to assess for fetal heart rate.  Patient acknowledges the purpose of the exam and the limitations of the study.  Constitutional: Well-developed, well-nourished pregnant female in no acute distress.  HEENT: PERRLA Skin: normal color and turgor, no rash Cardiovascular: normal rate & rhythm, no murmur Respiratory: normal effort, lung sounds clear throughout GI: Abd soft, non-tender, pos BS x 4, gravid appropriate for gestational age MS: Extremities nontender, no edema, normal ROM Neurologic: Alert and oriented x 4.  GU: no CVA tenderness Pelvic: Exam deferred  Assessment & Plan:  1. Encounter for supervision of low-risk pregnancy, antepartum - Blood Pressure Monitoring (BLOOD PRESSURE KIT) DEVI; 1 Device by Does not apply route as needed.  Dispense: 1 each; Refill: 0 - Misc.  Devices (GOJJI WEIGHT SCALE) MISC; 1 Device by Does not apply route as needed.  Dispense: 1 each; Refill: 0 - CBC/D/Plt+RPR+Rh+ABO+RubIgG... - Genetic Screening - Culture, OB Urine - Hemoglobin A1c - [place] MFM OB COMP + 14 WK; Future - GC/Chlamydia probe amp ([place])not at [facility] - [facility] AMB BABYSCRIPTS SCHEDULE OPTIMIZATION  2. [redacted] weeks gestation - Doing well overall, nervous when we could not hear a FHR, relieved when we saw baby move on bedside. Offered to have her return for a RN visit in two weeks to hear baby on Doppler.  3. Asthma affecting pregnancy in first trimester - albuterol (VENTOLIN HFA) 108 (90 Base) MCG/ACT inhaler; Inhale 2 puffs into the lungs every 4 (four) hours as needed.  Dispense: 18 g; Refill: 5  4. Initial OB visit - Initial labs drawn. - Continue prenatal vitamins. - Problem list reviewed and updated. - Genetic Screening discussed, First trimester screen, Quad screen, and NIPS: ordered. - Ultrasound discussed; fetal anatomic survey: ordered. - Anticipatory guidance about prenatal visits given including labs, ultrasounds, and testing. - Discussed usage of Babyscripts and virtual visits as additional source of managing and completing prenatal visits in midst of coronavirus and pandemic.   - Encouraged to complete MyChart Registration for her ability to review results, send requests, and have questions addressed.  - The nature of [place] for [facility] with multiple MDs and Advanced Practice Providers was explained to patient; also emphasized that residents, students are part of our team. - Routine obstetric precautions reviewed. Encouraged to seek out care at office or emergency room [facility] MAU preferred) for urgent and/or emergent concerns. Return in about 4 weeks (around 12/16/2020) for IN-PERSON, LOB.    Gaylan Gerold, MSN, CNM, [place] Certified Nurse Midwife, [facility] [facility]

## 2020-11-20 LAB — URINE CULTURE, OB REFLEX: Organism ID, Bacteria: NO GROWTH

## 2020-11-20 LAB — CULTURE, OB URINE

## 2020-11-26 ENCOUNTER — Encounter: Payer: Self-pay | Admitting: General Practice

## 2020-12-02 ENCOUNTER — Ambulatory Visit (INDEPENDENT_AMBULATORY_CARE_PROVIDER_SITE_OTHER): Payer: Medicaid Other | Admitting: General Practice

## 2020-12-02 ENCOUNTER — Other Ambulatory Visit: Payer: Self-pay

## 2020-12-02 VITALS — BP 108/66 | HR 79 | Ht 67.0 in | Wt 142.0 lb

## 2020-12-02 DIAGNOSIS — Z3401 Encounter for supervision of normal first pregnancy, first trimester: Secondary | ICD-10-CM

## 2020-12-02 DIAGNOSIS — O219 Vomiting of pregnancy, unspecified: Secondary | ICD-10-CM

## 2020-12-02 MED ORDER — PROMETHAZINE HCL 25 MG PO TABS: 25.0000 mg | ORAL_TABLET | Freq: Four times a day (QID) | ORAL | 0 refills | Status: DC | PRN

## 2020-12-02 MED ORDER — SCOPOLAMINE 1 MG/3DAYS TD PT72: 1.0000 | MEDICATED_PATCH | TRANSDERMAL | 0 refills | Status: DC

## 2020-12-02 NOTE — Progress Notes (Signed)
Chart reviewed for nurse visit. Agree with plan of care.   Clarnce Flock, MD 12/02/20 12:04 PM

## 2020-12-02 NOTE — Progress Notes (Signed)
Patient presents to office today for FHR check following new OB appt on 11/18/20. Patient reports continued nausea/vomiting- Zofran & Diclegis do not help at all. FHR 170bpm. Discussed with patient we will prescribe scopolamine patches & phenergan for nausea and to reach out to [place] if those medications do not work. Patient verbalized understanding & will return 9/20 for OB visit.   Koren Bound RN BSN 12/02/20

## 2020-12-15 ENCOUNTER — Encounter: Payer: Self-pay | Admitting: General Practice

## 2020-12-16 ENCOUNTER — Other Ambulatory Visit: Payer: Self-pay

## 2020-12-16 ENCOUNTER — Ambulatory Visit (INDEPENDENT_AMBULATORY_CARE_PROVIDER_SITE_OTHER): Payer: Medicaid Other | Admitting: Family Medicine

## 2020-12-16 ENCOUNTER — Encounter: Payer: Self-pay | Admitting: Family Medicine

## 2020-12-16 VITALS — BP 114/79 | HR 106 | Wt 139.1 lb

## 2020-12-16 DIAGNOSIS — Z3480 Encounter for supervision of other normal pregnancy, unspecified trimester: Secondary | ICD-10-CM | POA: Diagnosis not present

## 2020-12-16 DIAGNOSIS — Z23 Encounter for immunization: Secondary | ICD-10-CM | POA: Diagnosis not present

## 2020-12-16 NOTE — Progress Notes (Signed)
   Subjective:  Kendra Rosasco is a 20 y.o. G1P0000 at [redacted]w[redacted]d being seen today for ongoing prenatal care.  She is currently monitored for the following issues for this low-risk pregnancy and has Atopic dermatitis; Vitamin D deficiency; and Supervision of other normal pregnancy, antepartum on their problem list.  Patient reports no complaints.  Contractions: Not present. Vag. Bleeding: None.  Movement: Absent. Denies leaking of fluid.   The following portions of the patient's history were reviewed and updated as appropriate: allergies, current medications, past family history, past medical history, past social history, past surgical history and problem list. Problem list updated.  Objective:   Vitals:   12/16/20 1027  BP: 114/79  Pulse: (!) 106  Weight: 139 lb 1.6 oz (63.1 kg)    Fetal Status: Fetal Heart Rate (bpm): 163   Movement: Absent     General:  Alert, oriented and cooperative. Patient is in no acute distress.  Skin: Skin is warm and dry. No rash noted.   Cardiovascular: Normal heart rate noted  Respiratory: Normal respiratory effort, no problems with respiration noted  Abdomen: Soft, gravid, appropriate for gestational age. Pain/Pressure: Present     Pelvic: Vag. Bleeding: None     Cervical exam deferred        Extremities: Normal range of motion.  Edema: None  Mental Status: Normal mood and affect. Normal behavior. Normal judgment and thought content.   Urinalysis:      Assessment and Plan:  Pregnancy: G1P0000 at [redacted]w[redacted]d  1. Supervision of other normal pregnancy, antepartum BP and FHR normal Accepts flu vaccine today Given prior US with possible di-di twins seen with non viability of B BSUS was performed, only one fetus seen with normal somatic and cardiac activity Discussed [facility] clinic and patient would like to enroll - Flu Vaccine QUAD 45mo+IM (Fluarix, Fluzone & Alfiuria Quad PF)  Preterm labor symptoms and general obstetric precautions including but not limited to  vaginal bleeding, contractions, leaking of fluid and fetal movement were reviewed in detail with the patient. Please refer to After Visit Summary for other counseling recommendations.  Return in about 4 weeks (around 01/13/2021) for Dyad patient, ob visit.   Clarnce Flock, MD

## 2020-12-17 ENCOUNTER — Encounter: Payer: Medicaid Other | Admitting: Family Medicine

## 2021-01-01 ENCOUNTER — Encounter (HOSPITAL_COMMUNITY): Payer: Self-pay | Admitting: Obstetrics & Gynecology

## 2021-01-01 ENCOUNTER — Inpatient Hospital Stay (HOSPITAL_COMMUNITY)
Admission: AD | Admit: 2021-01-01 | Discharge: 2021-01-01 | Disposition: A | Discharge status: Home / Self Care | Payer: Medicaid Other | Attending: Obstetrics & Gynecology | Admitting: Obstetrics & Gynecology

## 2021-01-01 ENCOUNTER — Telehealth: Payer: Self-pay

## 2021-01-01 DIAGNOSIS — K5900 Constipation, unspecified: Secondary | ICD-10-CM

## 2021-01-01 DIAGNOSIS — O99612 Diseases of the digestive system complicating pregnancy, second trimester: Secondary | ICD-10-CM | POA: Insufficient documentation

## 2021-01-01 DIAGNOSIS — R102 Pelvic and perineal pain: Secondary | ICD-10-CM | POA: Diagnosis not present

## 2021-01-01 DIAGNOSIS — Z3492 Encounter for supervision of normal pregnancy, unspecified, second trimester: Secondary | ICD-10-CM

## 2021-01-01 DIAGNOSIS — O26892 Other specified pregnancy related conditions, second trimester: Secondary | ICD-10-CM | POA: Diagnosis not present

## 2021-01-01 DIAGNOSIS — Z3A16 16 weeks gestation of pregnancy: Secondary | ICD-10-CM | POA: Insufficient documentation

## 2021-01-01 LAB — URINALYSIS, ROUTINE W REFLEX MICROSCOPIC
Bilirubin Urine: NEGATIVE
Glucose, UA: NEGATIVE mg/dL
Hgb urine dipstick: NEGATIVE
Ketones, ur: NEGATIVE mg/dL
Leukocytes,Ua: NEGATIVE
Nitrite: NEGATIVE
Protein, ur: NEGATIVE mg/dL
Specific Gravity, Urine: 1.002 — ABNORMAL LOW (ref 1.005–1.030)
pH: 6 (ref 5.0–8.0)

## 2021-01-01 MED ORDER — DOCUSATE SODIUM 100 MG PO CAPS: 100.0000 mg | ORAL_CAPSULE | Freq: Two times a day (BID) | ORAL | 2 refills | Status: DC | PRN

## 2021-01-01 NOTE — MAU Note (Addendum)
Having a lot of pelvic pressure since yesterday. Called office today and told to take Miralax since no BM in a wk. Had BM and pressure improved alittle but is still present. Denies VB or d/c. Denies any pain. Pressure comes and goes but only a few times a day

## 2021-01-01 NOTE — MAU Provider Note (Signed)
History     CSN: [id number]  Arrival date and time: 01/01/21 2120   Event Date/Time   First Provider Initiated Contact with Patient 01/01/21 2201      Chief Complaint  Patient presents with   Pelvic Pain   HPI Kendra Barajas is a 20 y.o. G1P0000 at 12w5dwho presents to MAU with chief complaints of pelvic pain and constipation. These are recurrent problems. Patient's constipation predates pregnancy. Her pelvic pain began in early first trimester. She called the office earlier today to discuss these symptoms and was advised to trial Miralax for her symptoms.  She followed this advice, experienced a normal bowel movement, and her symptoms improved. She presents to MAU to "make sure everything is ok with the baby". She denies abdominal pain, vaginal bleeding, leaking of fluid, abnormal vaginal discharge, dysuria, back pain, flank pain, fever, falls, or recent illness.   [facility] [place] [facility]  OB History     Gravida  1   Para  0   Term  0   Preterm  0   AB  0   Living  0      SAB  0   IAB  0   Ectopic  0   Multiple  0   Live Births  0           Past Medical History:  Diagnosis Date   Anxiety    Asthma    Depression     Past Surgical History:  Procedure Laterality Date   Contracted toe Right 08/2019   FOOT SURGERY     WISDOM TOOTH EXTRACTION      Family History  Problem Relation Age of Onset   High blood pressure Mother    Anxiety disorder Mother    Schizophrenia Mother    Diabetes Father    Liver disease Father    Asthma Sister    Cancer Maternal Aunt    Heart failure Other     Social History   Tobacco Use   Smoking status: Never   Smokeless tobacco: Never  Substance Use Topics   Alcohol use: No   Drug use: No    Allergies: No Known Allergies  Medications Prior to Admission  Medication Sig Dispense Refill Last Dose   Prenatal Vit-Fe Fumarate-FA (PREPLUS) 27-1 MG TABS Take 1 tablet by mouth daily. 30 tablet 8 01/01/2021    acetaminophen (TYLENOL) 500 MG tablet Take 1 tablet (500 mg total) by mouth every 6 (six) hours as needed. (Patient not taking: Reported on 12/16/2020) 30 tablet 0    albuterol (VENTOLIN HFA) 108 (90 Base) MCG/ACT inhaler Inhale 2 puffs into the lungs every 4 (four) hours as needed. 18 g 5    Blood Pressure Monitoring (BLOOD PRESSURE KIT) DEVI 1 Device by Does not apply route as needed. 1 each 0    DERMA-SMOOTHE/FS SCALP 0.01 % OIL       Doxylamine-Pyridoxine (DICLEGIS) 10-10 MG TBEC Take 2 tablets at bedtime and one in the morning and one in the afternoon as needed for nausea. 60 tablet 2    Fluocinolone Acetonide Body 0.01 % OIL       fluocinonide ointment (LIDEX) 0.05 %       hydrocortisone 2.5 % cream       Misc. Devices (GOJJI WEIGHT SCALE) MISC 1 Device by Does not apply route as needed. 1 each 0    ondansetron (ZOFRAN-ODT) 8 MG disintegrating tablet Take 1 tablet (8 mg total) by mouth every 8 (  eight) hours as needed for nausea or vomiting. (Patient not taking: No sig reported) 60 tablet 0    promethazine (PHENERGAN) 25 MG tablet Take 1 tablet (25 mg total) by mouth every 6 (six) hours as needed for nausea or vomiting. 30 tablet 0    scopolamine (TRANSDERM-SCOP) 1 MG/3DAYS Place 1 patch (1.5 mg total) onto the skin every 3 (three) days. 10 patch 0    senna (SENOKOT) 8.6 MG TABS tablet Take 1 tablet (8.6 mg total) by mouth daily. (Patient not taking: No sig reported) 120 tablet 0    triamcinolone ointment (KENALOG) 0.1 %        Review of Systems  Genitourinary:  Positive for pelvic pain.  All other systems reviewed and are negative. Physical Exam   Blood pressure 113/62, pulse 75, temperature 99.1 F (37.3 C), temperature source Oral, resp. rate 16, height 5' 7.5" (1.715 m), weight 64 kg, last menstrual period 09/06/2020, SpO2 100 %.  Physical Exam Vitals and nursing note reviewed. Exam conducted with a chaperone present.  Constitutional:      Appearance: Normal appearance.   Cardiovascular:     Rate and Rhythm: Normal rate.     Pulses: Normal pulses.  Pulmonary:     Effort: Pulmonary effort is normal.  Abdominal:     Tenderness: There is no abdominal tenderness. There is no right CVA tenderness, left CVA tenderness or rebound.     Comments: Gravid  Skin:    Capillary Refill: Capillary refill takes less than 2 seconds.  Neurological:     Mental Status: She is alert and oriented to person, place, and time.  Psychiatric:        Mood and Affect: Mood normal.        Behavior: Behavior normal.        Thought Content: Thought content normal.        Judgment: Judgment normal.    MAU Course/MDM  Procedures  Orders Placed This Encounter  Procedures   Urinalysis, Routine w reflex microscopic Urine, Clean Catch   Discharge patient   Patient Vitals for the past 24 hrs:  BP Temp Temp src Pulse Resp SpO2 Height Weight  01/01/21 2155 113/62 99.1 F (37.3 C) Oral 75 16 100 % -- --  01/01/21 2131 124/66 -- -- 84 -- -- -- --  01/01/21 2130 -- 98.3 F (36.8 C) -- -- 16 -- 5' 7.5" (1.715 m) 64 kg   Results for orders placed or performed during the hospital encounter of 01/01/21 (from the past 24 hour(s))  Urinalysis, Routine w reflex microscopic Urine, Clean Catch     Status: Abnormal   Collection Time: 01/01/21  9:30 PM  Result Value Ref Range   Color, Urine STRAW (A) YELLOW   APPearance CLEAR CLEAR   Specific Gravity, Urine 1.002 (L) 1.005 - 1.030   pH 6.0 5.0 - 8.0   Glucose, UA NEGATIVE NEGATIVE mg/dL   Hgb urine dipstick NEGATIVE NEGATIVE   Bilirubin Urine NEGATIVE NEGATIVE   Ketones, ur NEGATIVE NEGATIVE mg/dL   Protein, ur NEGATIVE NEGATIVE mg/dL   Nitrite NEGATIVE NEGATIVE   Leukocytes,Ua NEGATIVE NEGATIVE   Meds ordered this encounter  Medications   docusate sodium (COLACE) 100 MG capsule    Sig: Take 1 capsule (100 mg total) by mouth 2 (two) times daily as needed.    Dispense:  30 capsule    Refill:  2    Order Specific Question:    Supervising Provider    Answer:  Janyth Pupa [[id number]]    Assessment and Plan  --20 y.o. G1P0000 at [redacted]w[redacted]d --FHT 161 by Doppler --Physiologic changes in second trimester --Constipation, continue Miralax PRN,  add Colace PRN --Discharge home in stable condition  SDarlina Rumpf MHenderson MSN, CNM

## 2021-01-01 NOTE — Telephone Encounter (Addendum)
Patient's mother called and spoke with front office, requested to speak with a nurse. Reported to front office staff that her daughter was experiencing pelvic pressure and is unable to walk. Call transferred to clinical staff. Pt's mother states she spoke with a nurse earlier today who said patient should be seen.   Requested to speak with patient. Pt reports pressure in vaginal area for the last few days. Reports some back pain since yesterday. Pt has not tried any intervention for discomfort. Reports last BM was last week. Recommended patient take Miralax once daily until BM becomes regular (pt confirms having this at home). Also encouraged pt to try two extra strength Tylenol for back pain and apply heat using shower or heating pad. Will call patient this afternoon to follow up on symptoms.   Called pt to follow up. Pt reports having taken Miralax and Tylenol. Reports BM since taking and improvement in discomfort. Encouraged pt to follow up with office as needed.

## 2021-01-01 NOTE — Discharge Instructions (Signed)

## 2021-01-14 ENCOUNTER — Ambulatory Visit (INDEPENDENT_AMBULATORY_CARE_PROVIDER_SITE_OTHER): Payer: Medicaid Other | Admitting: Family Medicine

## 2021-01-14 ENCOUNTER — Other Ambulatory Visit: Payer: Self-pay

## 2021-01-14 VITALS — BP 104/64 | HR 80 | Wt 141.1 lb

## 2021-01-14 DIAGNOSIS — Z3A18 18 weeks gestation of pregnancy: Secondary | ICD-10-CM

## 2021-01-14 DIAGNOSIS — Z3480 Encounter for supervision of other normal pregnancy, unspecified trimester: Secondary | ICD-10-CM

## 2021-01-14 NOTE — Progress Notes (Signed)
   Subjective:  Kendra Barajas is a 20 y.o. G1P0000 at [redacted]w[redacted]d being seen today for ongoing prenatal care.  She is currently monitored for the following issues for this low-risk pregnancy and has Atopic dermatitis; Vitamin D deficiency; and Supervision of other normal pregnancy, antepartum on their problem list.  Patient reports no complaints.  Contractions: Not present. Vag. Bleeding: None.  Movement: Absent. Denies leaking of fluid.   The following portions of the patient's history were reviewed and updated as appropriate: allergies, current medications, past family history, past medical history, past social history, past surgical history and problem list. Problem list updated.  Objective:   Vitals:   01/14/21 0827  BP: 104/64  Pulse: 80  Weight: 141 lb 1.6 oz (64 kg)    Fetal Status: Fetal Heart Rate (bpm): 164   Movement: Absent     General:  Alert, oriented and cooperative. Patient is in no acute distress.  Skin: Skin is warm and dry. No rash noted.   Cardiovascular: Normal heart rate noted  Respiratory: Normal respiratory effort, no problems with respiration noted  Abdomen: Soft, gravid, appropriate for gestational age. Pain/Pressure: Present     Pelvic: Vag. Bleeding: None     Cervical exam deferred        Extremities: Normal range of motion.  Edema: None  Mental Status: Normal mood and affect. Normal behavior. Normal judgment and thought content.   Urinalysis:      Assessment and Plan:  Pregnancy: G1P0000 at [redacted]w[redacted]d  1. Supervision of other normal pregnancy, antepartum BP and FHR normal AFP today Anatomy scan scheduled for next week - AFP, Serum, Open Spina Bifida  Preterm labor symptoms and general obstetric precautions including but not limited to vaginal bleeding, contractions, leaking of fluid and fetal movement were reviewed in detail with the patient. Please refer to After Visit Summary for other counseling recommendations.  Return in 4 weeks (on 02/11/2021) for  Dyad patient, ob visit.   Clarnce Flock, MD

## 2021-01-14 NOTE — Patient Instructions (Signed)

## 2021-01-16 LAB — AFP, SERUM, OPEN SPINA BIFIDA
AFP MoM: 1.25
AFP Value: 68.6 ng/mL
Gest. Age on Collection Date: 18.4 weeks
Maternal Age At EDD: 21 yr
OSBR Risk 1 IN: 10000
Test Results:: NEGATIVE
Weight: 141 [lb_av]

## 2021-01-19 ENCOUNTER — Ambulatory Visit: Payer: Medicaid Other | Attending: Certified Nurse Midwife

## 2021-01-19 ENCOUNTER — Other Ambulatory Visit: Payer: Self-pay

## 2021-01-19 ENCOUNTER — Other Ambulatory Visit: Payer: Self-pay | Admitting: Certified Nurse Midwife

## 2021-01-19 DIAGNOSIS — Z3490 Encounter for supervision of normal pregnancy, unspecified, unspecified trimester: Secondary | ICD-10-CM | POA: Diagnosis not present

## 2021-02-11 ENCOUNTER — Ambulatory Visit (INDEPENDENT_AMBULATORY_CARE_PROVIDER_SITE_OTHER): Payer: Medicaid Other | Admitting: Family Medicine

## 2021-02-11 ENCOUNTER — Encounter: Payer: Self-pay | Admitting: Family Medicine

## 2021-02-11 ENCOUNTER — Other Ambulatory Visit: Payer: Self-pay

## 2021-02-11 VITALS — BP 109/73 | HR 83 | Wt 144.1 lb

## 2021-02-11 DIAGNOSIS — K5900 Constipation, unspecified: Secondary | ICD-10-CM | POA: Insufficient documentation

## 2021-02-11 DIAGNOSIS — Z3400 Encounter for supervision of normal first pregnancy, unspecified trimester: Secondary | ICD-10-CM

## 2021-02-11 DIAGNOSIS — O99612 Diseases of the digestive system complicating pregnancy, second trimester: Secondary | ICD-10-CM

## 2021-02-11 DIAGNOSIS — O99619 Diseases of the digestive system complicating pregnancy, unspecified trimester: Secondary | ICD-10-CM | POA: Insufficient documentation

## 2021-02-11 DIAGNOSIS — E559 Vitamin D deficiency, unspecified: Secondary | ICD-10-CM

## 2021-02-11 NOTE — Progress Notes (Signed)
Patient in for routine prenatal visit, states she has been experiencing issues with constipation. Has tried Colace and Miralax with no relief.   Altha Harm, CMA

## 2021-02-11 NOTE — Patient Instructions (Signed)

## 2021-02-11 NOTE — Progress Notes (Signed)
   PRENATAL VISIT NOTE  Subjective:  Kendra Barajas is a 20 y.o. G1P0000 at [redacted]w[redacted]d being seen today for ongoing prenatal care.  She is currently monitored for the following issues for this low-risk pregnancy and has Atopic dermatitis; Vitamin D deficiency; and Supervision of normal first pregnancy, antepartum on their problem list.  Patient reports  constipation- tried miralax and colace .  Contractions: Not present. Vag. Bleeding: None.  Movement: Present. Denies leaking of fluid.   The following portions of the patient's history were reviewed and updated as appropriate: allergies, current medications, past family history, past medical history, past social history, past surgical history and problem list.   Objective:   Vitals:   02/11/21 0833  BP: 109/73  Pulse: 83  Weight: 144 lb 1.6 oz (65.4 kg)    Fetal Status: Fetal Heart Rate (bpm): 158   Movement: Present     General:  Alert, oriented and cooperative. Patient is in no acute distress.  Skin: Skin is warm and dry. No rash noted.   Cardiovascular: Normal heart rate noted  Respiratory: Normal respiratory effort, no problems with respiration noted  Abdomen: Soft, gravid, appropriate for gestational age.  Pain/Pressure: Present     Pelvic: Cervical exam deferred        Extremities: Normal range of motion.  Edema: None  Mental Status: Normal mood and affect. Normal behavior. Normal judgment and thought content.   Assessment and Plan:  Pregnancy: G1P0000 at [redacted]w[redacted]d  1. Constipation during pregnancy in second trimester Recommended miralax clean out-- in AVS Counseled on how to perform clean out  2. Supervision of normal first pregnancy, antepartum Up to date Has next appt scheduled  3. Vitamin D deficiency Needs recheck-- plan for 28wk visit   Preterm labor symptoms and general obstetric precautions including but not limited to vaginal bleeding, contractions, leaking of fluid and fetal movement were reviewed in detail with the  patient. Please refer to After Visit Summary for other counseling recommendations.   Return in about 4 weeks (around 03/11/2021) for Routine prenatal care, [facility] Dyad.  Future Appointments  Date Time Provider [place]  03/13/2021  8:55 AM [facility]    Caren Macadam, MD

## 2021-02-25 ENCOUNTER — Telehealth: Payer: Self-pay

## 2021-02-25 NOTE — Telephone Encounter (Signed)
Patient called front office and was transferred to clinical staff. Pt reports she began to feel funny so she walked to the bathroom, had a drink of water, and then walked back to her desk where she doesn't remember what happened. A coworker told patient that her face turned pale and she caught the patient so she didn't fall. Patient denies any injury, denies hitting belly, denies any pain or vaginal bleeding. Patient reports feeling fetal movement while walking to the bathroom, has not felt fetal movement since. Encouraged pt to drink some cold water and sit down in a quiet place to feel for movement. Encouraged patient that if she begins experiencing vaginal bleeding, pain, not feeling baby move-- she should go to the maternity assessment unit for evaluation. Reviewed with Ernestina Patches, MD who agrees with recommendation and states this is likely a vasovagal response and pt should go to MAU if she experiences another episode. Provider recommendation reviewed with patient.

## 2021-03-01 ENCOUNTER — Inpatient Hospital Stay (HOSPITAL_COMMUNITY)
Admission: AD | Admit: 2021-03-01 | Discharge: 2021-03-01 | Disposition: A | Discharge status: Home / Self Care | Payer: Medicaid Other | Attending: Obstetrics and Gynecology | Admitting: Obstetrics and Gynecology

## 2021-03-01 ENCOUNTER — Encounter (HOSPITAL_COMMUNITY): Payer: Self-pay | Admitting: Obstetrics and Gynecology

## 2021-03-01 ENCOUNTER — Other Ambulatory Visit: Payer: Self-pay

## 2021-03-01 DIAGNOSIS — O368120 Decreased fetal movements, second trimester, not applicable or unspecified: Secondary | ICD-10-CM

## 2021-03-01 DIAGNOSIS — Z3689 Encounter for other specified antenatal screening: Secondary | ICD-10-CM

## 2021-03-01 DIAGNOSIS — Z3A25 25 weeks gestation of pregnancy: Secondary | ICD-10-CM | POA: Diagnosis not present

## 2021-03-01 DIAGNOSIS — O26892 Other specified pregnancy related conditions, second trimester: Secondary | ICD-10-CM | POA: Diagnosis not present

## 2021-03-01 DIAGNOSIS — R300 Dysuria: Secondary | ICD-10-CM | POA: Diagnosis not present

## 2021-03-01 DIAGNOSIS — O368121 Decreased fetal movements, second trimester, fetus 1: Secondary | ICD-10-CM | POA: Diagnosis not present

## 2021-03-01 LAB — URINALYSIS, ROUTINE W REFLEX MICROSCOPIC
Bilirubin Urine: NEGATIVE
Glucose, UA: NEGATIVE mg/dL
Hgb urine dipstick: NEGATIVE
Ketones, ur: NEGATIVE mg/dL
Nitrite: NEGATIVE
Protein, ur: NEGATIVE mg/dL
Specific Gravity, Urine: 1.006 (ref 1.005–1.030)
pH: 7 (ref 5.0–8.0)

## 2021-03-01 NOTE — MAU Provider Note (Signed)
History     CSN: [id number]  Arrival date and time: 03/01/21 1340   Event Date/Time   First Provider Initiated Contact with Patient 03/01/21 1445      Chief Complaint  Patient presents with   Decreased Fetal Movement   Dysuria   HPI Kendra Barajas is a 20 y.o. G1P0000 at 55w1dwho presents with decreased fetal movement. Reports feeling no movement since 7 am this morning. Also reports new onset dysuria. Denies contractions, LOF, vaginal bleeding. Denies hematuria, flank pain, or urinary frequency.   OB History     Gravida  1   Para  0   Term  0   Preterm  0   AB  0   Living  0      SAB  0   IAB  0   Ectopic  0   Multiple  0   Live Births  0           Past Medical History:  Diagnosis Date   Anxiety    Asthma    Depression     Past Surgical History:  Procedure Laterality Date   Contracted toe Right 08/2019   FOOT SURGERY     WISDOM TOOTH EXTRACTION      Family History  Problem Relation Age of Onset   High blood pressure Mother    Anxiety disorder Mother    Schizophrenia Mother    Diabetes Father    Liver disease Father    Asthma Sister    Cancer Maternal Aunt    Heart failure Other     Social History   Tobacco Use   Smoking status: Never   Smokeless tobacco: Never  Vaping Use   Vaping Use: Never used  Substance Use Topics   Alcohol use: No   Drug use: No    Allergies: No Known Allergies  Medications Prior to Admission  Medication Sig Dispense Refill Last Dose   albuterol (VENTOLIN HFA) 108 (90 Base) MCG/ACT inhaler Inhale 2 puffs into the lungs every 4 (four) hours as needed. 18 g 5 02/25/2021   docusate sodium (COLACE) 100 MG capsule Take 1 capsule (100 mg total) by mouth 2 (two) times daily as needed. 30 capsule 2 Past Week   Prenatal Vit-Fe Fumarate-FA (PREPLUS) 27-1 MG TABS Take 1 tablet by mouth daily. 30 tablet 8 03/01/2021   acetaminophen (TYLENOL) 500 MG tablet Take 1 tablet (500 mg total) by mouth every 6 (six) hours  as needed. (Patient not taking: No sig reported) 30 tablet 0    Blood Pressure Monitoring (BLOOD PRESSURE KIT) DEVI 1 Device by Does not apply route as needed. 1 each 0    Misc. Devices (GOJJI WEIGHT SCALE) MISC 1 Device by Does not apply route as needed. 1 each 0    senna (SENOKOT) 8.6 MG TABS tablet Take 1 tablet (8.6 mg total) by mouth daily. (Patient not taking: No sig reported) 120 tablet 0    triamcinolone ointment (KENALOG) 0.1 %        Review of Systems  Constitutional: Negative.   Gastrointestinal: Negative.   Genitourinary:  Positive for dysuria. Negative for flank pain, frequency, vaginal bleeding and vaginal discharge.  Physical Exam   Blood pressure (!) 110/53, pulse 79, temperature 99 F (37.2 C), temperature source Oral, resp. rate 18, height _0  (1.702 m), weight 65.5 kg, last menstrual period 09/06/2020, SpO2 100 %.  Physical Exam Vitals and nursing note reviewed.  Constitutional:      General: She  is not in acute distress.    Appearance: Normal appearance. She is not ill-appearing.  HENT:     Head: Normocephalic and atraumatic.  Eyes:     General: No scleral icterus.    Pupils: Pupils are equal, round, and reactive to light.  Pulmonary:     Effort: Pulmonary effort is normal. No respiratory distress.  Abdominal:     Tenderness: There is no right CVA tenderness or left CVA tenderness.  Skin:    General: Skin is warm and dry.  Neurological:     General: No focal deficit present.     Mental Status: She is alert.  Psychiatric:        Mood and Affect: Mood normal.        Behavior: Behavior normal.   NST:  Baseline: 150 bpm, Variability: Good {> 6 bpm), Accelerations: Reactive, and Decelerations: Absent  MAU Course  Procedures Results for orders placed or performed during the hospital encounter of 03/01/21 (from the past 24 hour(s))  Urinalysis, Routine w reflex microscopic Urine, Clean Catch     Status: Abnormal   Collection Time: 03/01/21  2:31 PM  Result  Value Ref Range   Color, Urine STRAW (A) YELLOW   APPearance CLEAR CLEAR   Specific Gravity, Urine 1.006 1.005 - 1.030   pH 7.0 5.0 - 8.0   Glucose, UA NEGATIVE NEGATIVE mg/dL   Hgb urine dipstick NEGATIVE NEGATIVE   Bilirubin Urine NEGATIVE NEGATIVE   Ketones, ur NEGATIVE NEGATIVE mg/dL   Protein, ur NEGATIVE NEGATIVE mg/dL   Nitrite NEGATIVE NEGATIVE   Leukocytes,Ua TRACE (A) NEGATIVE   RBC / HPF 0-5 0 - 5 RBC/hpf   WBC, UA 0-5 0 - 5 WBC/hpf   Bacteria, UA MANY (A) NONE SEEN   Squamous Epithelial / LPF 0-5 0 - 5    MDM Patient has reactive fetal tracing & documents 11 movements while being monitored. Reassured and ready for discharge.   U/a indeterminate. She is afebrile & has no CVA tenderness. Will culture urine.   Assessment and Plan   1. Decreased fetal movements in second trimester, single or unspecified fetus   2. NST (non-stress test) reactive   3. [redacted] weeks gestation of pregnancy    -discussed fetal movement expectations & reasons to return to MAU -urine culture pending  Jorje Guild 03/01/2021, 2:45 PM

## 2021-03-01 NOTE — MAU Note (Signed)
Presents with c/o decreased FM, no movement since 0300 this morning.  Denies LOF or VB.  Also states having pain with urination, increased frequency and urgency.

## 2021-03-02 LAB — CULTURE, OB URINE
Culture: NO GROWTH
Special Requests: NORMAL

## 2021-03-11 ENCOUNTER — Other Ambulatory Visit: Payer: Self-pay | Admitting: *Deleted

## 2021-03-11 DIAGNOSIS — Z3480 Encounter for supervision of other normal pregnancy, unspecified trimester: Secondary | ICD-10-CM

## 2021-03-13 ENCOUNTER — Ambulatory Visit (INDEPENDENT_AMBULATORY_CARE_PROVIDER_SITE_OTHER): Payer: Medicaid Other | Admitting: Obstetrics and Gynecology

## 2021-03-13 ENCOUNTER — Other Ambulatory Visit: Payer: Medicaid Other

## 2021-03-13 ENCOUNTER — Encounter: Payer: Self-pay | Admitting: Obstetrics and Gynecology

## 2021-03-13 ENCOUNTER — Other Ambulatory Visit: Payer: Self-pay

## 2021-03-13 VITALS — BP 122/81 | HR 90 | Wt 145.2 lb

## 2021-03-13 DIAGNOSIS — Z3480 Encounter for supervision of other normal pregnancy, unspecified trimester: Secondary | ICD-10-CM

## 2021-03-13 DIAGNOSIS — O99511 Diseases of the respiratory system complicating pregnancy, first trimester: Secondary | ICD-10-CM

## 2021-03-13 DIAGNOSIS — Z23 Encounter for immunization: Secondary | ICD-10-CM

## 2021-03-13 DIAGNOSIS — Z3400 Encounter for supervision of normal first pregnancy, unspecified trimester: Secondary | ICD-10-CM | POA: Diagnosis not present

## 2021-03-13 DIAGNOSIS — J45909 Unspecified asthma, uncomplicated: Secondary | ICD-10-CM

## 2021-03-13 MED ORDER — FLUTICASONE-SALMETEROL 100-50 MCG/ACT IN AEPB: 1.0000 | INHALATION_SPRAY | Freq: Two times a day (BID) | RESPIRATORY_TRACT | 3 refills | Status: DC

## 2021-03-13 MED ORDER — ALBUTEROL SULFATE HFA 108 (90 BASE) MCG/ACT IN AERS: 2.0000 | INHALATION_SPRAY | RESPIRATORY_TRACT | 5 refills | Status: DC | PRN

## 2021-03-13 NOTE — Progress Notes (Signed)
° °  PRENATAL VISIT NOTE  Subjective:  Kendra Barajas is a 20 y.o. G1P0000 at [redacted]w[redacted]d being seen today for ongoing prenatal care.  She is currently monitored for the following issues for this low-risk pregnancy and has Atopic dermatitis; Vitamin D deficiency; Supervision of normal first pregnancy, antepartum; and Constipation during pregnancy on their problem list.  Patient reports no complaints.  Contractions: Not present. Vag. Bleeding: None.  Movement: Present. Denies leaking of fluid.   The following portions of the patient's history were reviewed and updated as appropriate: allergies, current medications, past family history, past medical history, past social history, past surgical history and problem list.   Objective:   Vitals:   03/13/21 0913  BP: 122/81  Pulse: 90  Weight: 145 lb 3.2 oz (65.9 kg)    Fetal Status: Fetal Heart Rate (bpm): 161 Fundal Height: 27 cm Movement: Present     General:  Alert, oriented and cooperative. Patient is in no acute distress.  Skin: Skin is warm and dry. No rash noted.   Cardiovascular: Normal heart rate noted  Respiratory: Normal respiratory effort, no problems with respiration noted  Abdomen: Soft, gravid, appropriate for gestational age.  Pain/Pressure: Present     Pelvic: Cervical exam deferred        Extremities: Normal range of motion.  Edema: None  Mental Status: Normal mood and affect. Normal behavior. Normal judgment and thought content.   Assessment and Plan:  Pregnancy: G1P0000 at [redacted]w[redacted]d 1. Supervision of normal first pregnancy, antepartum Patient is doing well without complaints Third trimester labs today with glucola Patient is considering LARC for contraception - Tdap vaccine greater than or equal to 7yo IM  2. Asthma affecting pregnancy in first trimester Refill on Albuterol provided Rx Advair also provided  Preterm labor symptoms and general obstetric precautions including but not limited to vaginal bleeding, contractions,  leaking of fluid and fetal movement were reviewed in detail with the patient. Please refer to After Visit Summary for other counseling recommendations.   Return in about 2 weeks (around 03/27/2021) for in person, ROB, Low risk.  Future Appointments  Date Time Provider [place]  03/31/2021  9:55 AM [facility]  04/13/2021 10:35 AM [facility]  04/28/2021 10:55 AM [facility]  05/13/2021 10:55 AM [facility]  05/22/2021  9:55 AM [facility] [place]  05/29/2021  9:15 AM [facility]    Jalia Zuniga, MD

## 2021-03-14 LAB — CBC
Hematocrit: 32 % — ABNORMAL LOW (ref 34.0–46.6)
Hemoglobin: 10.6 g/dL — ABNORMAL LOW (ref 11.1–15.9)
MCH: 30.5 pg (ref 26.6–33.0)
MCHC: 33.1 g/dL (ref 31.5–35.7)
MCV: 92 fL (ref 79–97)
Platelets: 171 10*3/uL (ref 150–450)
RBC: 3.48 x10E6/uL — ABNORMAL LOW (ref 3.77–5.28)
RDW: 12.2 % (ref 11.7–15.4)
WBC: 7.4 10*3/uL (ref 3.4–10.8)

## 2021-03-14 LAB — HIV ANTIBODY (ROUTINE TESTING W REFLEX): HIV Screen 4th Generation wRfx: NONREACTIVE

## 2021-03-14 LAB — GLUCOSE TOLERANCE, 2 HOURS W/ 1HR
Glucose, 1 hour: 102 mg/dL (ref 70–179)
Glucose, 2 hour: 82 mg/dL (ref 70–152)
Glucose, Fasting: 74 mg/dL (ref 70–91)

## 2021-03-14 LAB — RPR: RPR Ser Ql: NONREACTIVE

## 2021-03-24 ENCOUNTER — Encounter (HOSPITAL_COMMUNITY): Payer: Self-pay | Admitting: Obstetrics & Gynecology

## 2021-03-24 ENCOUNTER — Encounter: Payer: Self-pay | Admitting: Family Medicine

## 2021-03-24 ENCOUNTER — Other Ambulatory Visit: Payer: Self-pay

## 2021-03-24 ENCOUNTER — Inpatient Hospital Stay (HOSPITAL_COMMUNITY)
Admission: AD | Admit: 2021-03-24 | Discharge: 2021-03-24 | Disposition: A | Discharge status: Home / Self Care | Payer: Medicaid Other | Attending: Obstetrics & Gynecology | Admitting: Obstetrics & Gynecology

## 2021-03-24 DIAGNOSIS — R109 Unspecified abdominal pain: Secondary | ICD-10-CM | POA: Diagnosis not present

## 2021-03-24 DIAGNOSIS — O368130 Decreased fetal movements, third trimester, not applicable or unspecified: Secondary | ICD-10-CM | POA: Diagnosis not present

## 2021-03-24 DIAGNOSIS — R12 Heartburn: Secondary | ICD-10-CM | POA: Insufficient documentation

## 2021-03-24 DIAGNOSIS — R21 Rash and other nonspecific skin eruption: Secondary | ICD-10-CM | POA: Insufficient documentation

## 2021-03-24 DIAGNOSIS — O99613 Diseases of the digestive system complicating pregnancy, third trimester: Secondary | ICD-10-CM | POA: Diagnosis not present

## 2021-03-24 DIAGNOSIS — M545 Low back pain, unspecified: Secondary | ICD-10-CM | POA: Insufficient documentation

## 2021-03-24 DIAGNOSIS — Z3A28 28 weeks gestation of pregnancy: Secondary | ICD-10-CM | POA: Diagnosis not present

## 2021-03-24 DIAGNOSIS — R1010 Upper abdominal pain, unspecified: Secondary | ICD-10-CM | POA: Diagnosis not present

## 2021-03-24 DIAGNOSIS — O99612 Diseases of the digestive system complicating pregnancy, second trimester: Secondary | ICD-10-CM

## 2021-03-24 DIAGNOSIS — O26893 Other specified pregnancy related conditions, third trimester: Secondary | ICD-10-CM | POA: Diagnosis present

## 2021-03-24 DIAGNOSIS — Z3400 Encounter for supervision of normal first pregnancy, unspecified trimester: Secondary | ICD-10-CM

## 2021-03-24 LAB — URINALYSIS, MICROSCOPIC (REFLEX)

## 2021-03-24 LAB — URINALYSIS, ROUTINE W REFLEX MICROSCOPIC
Bilirubin Urine: NEGATIVE
Glucose, UA: NEGATIVE mg/dL
Hgb urine dipstick: NEGATIVE
Ketones, ur: NEGATIVE mg/dL
Nitrite: NEGATIVE
Protein, ur: NEGATIVE mg/dL
Specific Gravity, Urine: 1.02 (ref 1.005–1.030)
pH: 7.5 (ref 5.0–8.0)

## 2021-03-24 MED ORDER — FAMOTIDINE 20 MG PO TABS
20.0000 mg | ORAL_TABLET | Freq: Once | ORAL | Status: AC
  Administered 2021-03-24: 15:00:00 20 mg via ORAL
  Filled 2021-03-24: qty 1

## 2021-03-24 MED ORDER — FAMOTIDINE 20 MG PO TABS: 20.0000 mg | ORAL_TABLET | Freq: Two times a day (BID) | ORAL | 0 refills | Status: DC

## 2021-03-24 NOTE — MAU Provider Note (Signed)
History     CSN: [id number]  Arrival date and time: 03/24/21 1312   Event Date/Time   First Provider Initiated Contact with Patient 03/24/21 1337      Chief Complaint  Patient presents with   Abdominal Pain   Back Pain   Decreased Fetal Movement   Abdominal Pain Associated symptoms include frequency and a rash (face). Pertinent negatives include no anxiety, diarrhea, dysuria, fever, nausea, sore throat or vomiting.  Back Pain Associated symptoms include abdominal pain. Pertinent negatives include no chest pain, dysuria, fever or weakness.  Kendra Barajas is a 20 y.o. G1P0000 _0  gestation who presents to the MAU with c/o abdominal pain and decreased fetal movement. Patient reports that she began having pain yesterday that was located in the left upper abdomen. The pain radiates to the lower back. The pain was intermittent starting about 9 am and continued though the day coming often and lasting for a few seconds. Last night the pain increased after going to bed and continued today. The pain became constant at 4am. She rates the pain as 8/10. She reports taking a warm bath minimal relief. Patient states she has not taken any tylenol or other medications. Patient denies vaginal bleeding, discharge or leaking of fluid.   Patient reports having heart burn 2 days ago and took Tums but vomited soon after. She ate Crab Legs with butter Christmas Day. Today had waffles before coming to MAU. Patient states that her home health nurse told her she was having Braxton Hicks contraction.   OB History     Gravida  1   Para  0   Term  0   Preterm  0   AB  0   Living  0      SAB  0   IAB  0   Ectopic  0   Multiple  0   Live Births  0           Past Medical History:  Diagnosis Date   Anxiety    Asthma    Depression     Past Surgical History:  Procedure Laterality Date   Contracted toe Right 08/2019   FOOT SURGERY     WISDOM TOOTH EXTRACTION      Family History   Problem Relation Age of Onset   High blood pressure Mother    Anxiety disorder Mother    Schizophrenia Mother    Diabetes Father    Liver disease Father    Asthma Sister    Cancer Maternal Aunt    Heart failure Other     Social History   Tobacco Use   Smoking status: Never   Smokeless tobacco: Never  Vaping Use   Vaping Use: Never used  Substance Use Topics   Alcohol use: No   Drug use: No    Allergies: No Known Allergies  Medications Prior to Admission  Medication Sig Dispense Refill Last Dose   albuterol (VENTOLIN HFA) 108 (90 Base) MCG/ACT inhaler Inhale 2 puffs into the lungs every 4 (four) hours as needed. 18 g 5 03/23/2021   Prenatal Vit-Fe Fumarate-FA (PREPLUS) 27-1 MG TABS Take 1 tablet by mouth daily. 30 tablet 8 03/24/2021   acetaminophen (TYLENOL) 500 MG tablet Take 1 tablet (500 mg total) by mouth every 6 (six) hours as needed. 30 tablet 0 More than a month   Blood Pressure Monitoring (BLOOD PRESSURE KIT) DEVI 1 Device by Does not apply route as needed. 1 each 0    docusate  sodium (COLACE) 100 MG capsule Take 1 capsule (100 mg total) by mouth 2 (two) times daily as needed. 30 capsule 2    fluticasone-salmeterol (ADVAIR) 100-50 MCG/ACT AEPB Inhale 1 puff into the lungs 2 (two) times daily. 1 each 3    Misc. Devices (GOJJI WEIGHT SCALE) MISC 1 Device by Does not apply route as needed. 1 each 0    triamcinolone ointment (KENALOG) 0.1 %        Review of Systems  Constitutional:  Negative for chills and fever.  HENT:  Negative for congestion, ear pain, sinus pressure and sore throat.   Eyes:  Positive for itching. Negative for redness and visual disturbance.  Respiratory:  Positive for wheezing. Negative for cough and shortness of breath.   Cardiovascular:  Negative for chest pain and palpitations. Leg swelling: last night, none today. Gastrointestinal:  Positive for abdominal pain. Negative for diarrhea, nausea and vomiting.  Genitourinary:  Positive for  frequency. Negative for dysuria, vaginal bleeding and vaginal discharge.  Musculoskeletal:  Positive for back pain.  Skin:  Positive for rash (face).  Neurological:  Negative for syncope, weakness and light-headedness.  Psychiatric/Behavioral:  Negative for confusion. The patient is not nervous/anxious.   Physical Exam   Blood pressure 109/65, pulse 83, temperature 97.8 F (36.6 C), temperature source Oral, resp. rate 14, last menstrual period 09/06/2020, SpO2 100 %.  Physical Exam Constitutional:      General: She is not in acute distress.    Appearance: Normal appearance.  HENT:     Head: Normocephalic.  Eyes:     Conjunctiva/sclera: Conjunctivae normal.  Cardiovascular:     Rate and Rhythm: Normal rate and regular rhythm.     Heart sounds: No murmur heard. Pulmonary:     Effort: Pulmonary effort is normal.     Breath sounds: No wheezing or rales.  Abdominal:     General: Bowel sounds are normal.     Palpations: Abdomen is soft.     Tenderness: There is abdominal tenderness in the epigastric area and left upper quadrant. There is no guarding or rebound.     Comments: Gravid, consistent with dates  Musculoskeletal:        General: No swelling or tenderness. Normal range of motion.     Cervical back: Neck supple.     Lumbar back: No tenderness (tenderness on palpation Lumbar area that is mild).     Right lower leg: No edema.     Left lower leg: No edema.     Comments: No clonus  Skin:    General: Skin is warm and dry.  Neurological:     General: No focal deficit present.     Mental Status: She is alert.  Psychiatric:        Mood and Affect: Mood normal.    MAU Course  Procedures 2:40 pm patient reports no pain at this time. Discussed results of urine and fetal monitor tracing. Patient given Pepcid 20 mg PO.  3:09pm Patient reports she is feeling good fetal movement and has no pain.  MDM EFM: baseline 150, moderate variability, accelerations, no deceleration, reactive  tracing, no contraction.   Assessment and Plan  20 y.o. G1P0000 _0  gestation with c/o abdominal pain and decreased FM that resolved after arrival to MAU. Patient with reactive fetal monitor tracing. No concern for acute abdomen or preterm labor at this time. Urine is normal. Patient is pain free at time of discharge. I discussed with the patient in detail reasons for  return including vaginal bleeding, leaking of fluid, increased pain, fever, decreased fetal movement or other concerns. Patient voices understanding and agrees with plan.   Kendra Barajas 03/24/2021, 1:40 PM

## 2021-03-24 NOTE — MAU Note (Signed)
.  Kendra Barajas is a 20 y.o. at [redacted]w[redacted]d here in MAU reporting: abdominal and back pain that started yesterday. She states that it was coming and going but now it is constant. DFM today, has only felt the baby move x1 earlier this morning. Denies VB or LOF.   Pain score: 8  Lab orders placed from triage:  UA

## 2021-03-29 NOTE — L&D Delivery Note (Signed)
Delivery Note ?Pt progressed steadily through labor, labored down for about an hour.  After a 30 minute 2nd stage, at 12:34 PM a viable female was delivered via Vaginal, Spontaneous (Presentation: Right Occiput Anterior).  APGAR: 8, 9; weight pending.  After 1 minute, the cord was clamped and cut. 40 units of pitocin diluted in 1000cc LR was infused rapidly IV.  The placenta separated spontaneously and delivered via CCT and maternal pushing effort.  It was inspected and appears to be intact with a 3 VC.  ? ?Anesthesia: Epidural ?Episiotomy: None ?Lacerations: None ?Suture Repair:  ?Est. Blood Loss (mL): 200 ? ?Mom to postpartum.  Baby to Couplet care / Skin to Skin. ? ?Kendra Barajas ?06/04/2021, 12:51 PM ? ? ? ?

## 2021-03-31 ENCOUNTER — Ambulatory Visit (INDEPENDENT_AMBULATORY_CARE_PROVIDER_SITE_OTHER): Payer: Medicaid Other | Admitting: Family Medicine

## 2021-03-31 ENCOUNTER — Other Ambulatory Visit: Payer: Self-pay | Admitting: General Practice

## 2021-03-31 ENCOUNTER — Other Ambulatory Visit: Payer: Medicaid Other

## 2021-03-31 ENCOUNTER — Other Ambulatory Visit: Payer: Self-pay | Admitting: Nurse Practitioner

## 2021-03-31 ENCOUNTER — Other Ambulatory Visit: Payer: Self-pay

## 2021-03-31 VITALS — BP 111/78 | HR 86 | Wt 145.0 lb

## 2021-03-31 DIAGNOSIS — O99612 Diseases of the digestive system complicating pregnancy, second trimester: Secondary | ICD-10-CM

## 2021-03-31 DIAGNOSIS — Z3400 Encounter for supervision of normal first pregnancy, unspecified trimester: Secondary | ICD-10-CM

## 2021-03-31 DIAGNOSIS — K5900 Constipation, unspecified: Secondary | ICD-10-CM

## 2021-03-31 NOTE — Patient Instructions (Signed)

## 2021-03-31 NOTE — Progress Notes (Signed)
° °  Subjective:  Kendra Barajas is a 21 y.o. G1P0000 at [redacted]w[redacted]d being seen today for ongoing prenatal care.  She is currently monitored for the following issues for this low-risk pregnancy and has Atopic dermatitis; Vitamin D deficiency; Supervision of normal first pregnancy, antepartum; and Constipation during pregnancy on their problem list.  Patient reports no complaints.  Contractions: Not present. Vag. Bleeding: None.  Movement: Present. Denies leaking of fluid.   The following portions of the patient's history were reviewed and updated as appropriate: allergies, current medications, past family history, past medical history, past social history, past surgical history and problem list. Problem list updated.  Objective:   Vitals:   03/31/21 1006  BP: 111/78  Pulse: 86  Weight: 145 lb (65.8 kg)    Fetal Status: Fetal Heart Rate (bpm): 148   Movement: Present     General:  Alert, oriented and cooperative. Patient is in no acute distress.  Skin: Skin is warm and dry. No rash noted.   Cardiovascular: Normal heart rate noted  Respiratory: Normal respiratory effort, no problems with respiration noted  Abdomen: Soft, gravid, appropriate for gestational age. Pain/Pressure: Present     Pelvic: Vag. Bleeding: None     Cervical exam deferred        Extremities: Normal range of motion.  Edema: None  Mental Status: Normal mood and affect. Normal behavior. Normal judgment and thought content.   Urinalysis:      Assessment and Plan:  Pregnancy: G1P0000 at [redacted]w[redacted]d  1. Supervision of normal first pregnancy, antepartum BP and FHR normal 28wk labs and TDaP done last visit Discussed circumcision, cosmetic/religious procedure, she will consider  Preterm labor symptoms and general obstetric precautions including but not limited to vaginal bleeding, contractions, leaking of fluid and fetal movement were reviewed in detail with the patient. Please refer to After Visit Summary for other counseling  recommendations.  Return in 2 weeks (on 04/14/2021) for Dyad patient, ob visit.   Clarnce Flock, MD

## 2021-04-13 ENCOUNTER — Ambulatory Visit (INDEPENDENT_AMBULATORY_CARE_PROVIDER_SITE_OTHER): Payer: Medicaid Other | Admitting: Family Medicine

## 2021-04-13 ENCOUNTER — Other Ambulatory Visit: Payer: Self-pay

## 2021-04-13 VITALS — BP 112/67 | HR 82 | Wt 148.2 lb

## 2021-04-13 DIAGNOSIS — Z3400 Encounter for supervision of normal first pregnancy, unspecified trimester: Secondary | ICD-10-CM

## 2021-04-13 DIAGNOSIS — O99613 Diseases of the digestive system complicating pregnancy, third trimester: Secondary | ICD-10-CM

## 2021-04-13 DIAGNOSIS — K5900 Constipation, unspecified: Secondary | ICD-10-CM

## 2021-04-16 NOTE — Progress Notes (Signed)
° °  PRENATAL VISIT NOTE  Subjective:  Kendra Barajas is a 21 y.o. G1P0000 at [redacted]w[redacted]d being seen today for ongoing prenatal care.  She is currently monitored for the following issues for this low-risk pregnancy and has Atopic dermatitis; Vitamin D deficiency; Supervision of normal first pregnancy, antepartum; and Constipation during pregnancy on their problem list.  Patient reports no complaints.  Contractions: Irritability. Vag. Bleeding: None.  Movement: Present. Denies leaking of fluid.   The following portions of the patient's history were reviewed and updated as appropriate: allergies, current medications, past family history, past medical history, past social history, past surgical history and problem list.   Objective:   Vitals:   04/13/21 1051  BP: 112/67  Pulse: 82  Weight: 148 lb 3.2 oz (67.2 kg)    Fetal Status: Fetal Heart Rate (bpm): 145   Movement: Present     General:  Alert, oriented and cooperative. Patient is in no acute distress.  Skin: Skin is warm and dry. No rash noted.   Cardiovascular: Normal heart rate noted  Respiratory: Normal respiratory effort, no problems with respiration noted  Abdomen: Soft, gravid, appropriate for gestational age.  Pain/Pressure: Present     Pelvic: Cervical exam deferred        Extremities: Normal range of motion.  Edema: None  Mental Status: Normal mood and affect. Normal behavior. Normal judgment and thought content.   Assessment and Plan:  Pregnancy: G1P0000 at [redacted]w[redacted]d 1. Supervision of normal first pregnancy, antepartum TWG=-5 lb 12.8 oz (-2.631 kg)  Up to date Reviewed labor precautions  2. Constipation during pregnancy in third trimester Discussed otc remedies  Preterm labor symptoms and general obstetric precautions including but not limited to vaginal bleeding, contractions, leaking of fluid and fetal movement were reviewed in detail with the patient. Please refer to After Visit Summary for other counseling recommendations.    No follow-ups on file.  Future Appointments  Date Time Provider [place]  04/28/2021 10:55 AM [facility]  05/13/2021 10:55 AM [facility]  05/22/2021  9:55 AM [facility]  05/29/2021  9:15 AM [facility]    Caren Macadam, MD

## 2021-04-23 ENCOUNTER — Other Ambulatory Visit: Payer: Self-pay

## 2021-04-23 ENCOUNTER — Inpatient Hospital Stay (HOSPITAL_COMMUNITY)
Admission: AD | Admit: 2021-04-23 | Discharge: 2021-04-24 | Disposition: A | Discharge status: Home / Self Care | Payer: Medicaid Other | Attending: Obstetrics and Gynecology | Admitting: Obstetrics and Gynecology

## 2021-04-23 DIAGNOSIS — Z3400 Encounter for supervision of normal first pregnancy, unspecified trimester: Secondary | ICD-10-CM

## 2021-04-23 DIAGNOSIS — K5900 Constipation, unspecified: Secondary | ICD-10-CM | POA: Insufficient documentation

## 2021-04-23 DIAGNOSIS — Z3A32 32 weeks gestation of pregnancy: Secondary | ICD-10-CM | POA: Diagnosis not present

## 2021-04-23 DIAGNOSIS — O4703 False labor before 37 completed weeks of gestation, third trimester: Secondary | ICD-10-CM | POA: Diagnosis not present

## 2021-04-23 DIAGNOSIS — O26893 Other specified pregnancy related conditions, third trimester: Secondary | ICD-10-CM | POA: Insufficient documentation

## 2021-04-23 DIAGNOSIS — O99613 Diseases of the digestive system complicating pregnancy, third trimester: Secondary | ICD-10-CM | POA: Diagnosis not present

## 2021-04-23 LAB — URINALYSIS, ROUTINE W REFLEX MICROSCOPIC
Bilirubin Urine: NEGATIVE
Glucose, UA: NEGATIVE mg/dL
Hgb urine dipstick: NEGATIVE
Ketones, ur: NEGATIVE mg/dL
Leukocytes,Ua: NEGATIVE
Nitrite: NEGATIVE
Protein, ur: NEGATIVE mg/dL
Specific Gravity, Urine: 1.003 — ABNORMAL LOW (ref 1.005–1.030)
pH: 6 (ref 5.0–8.0)

## 2021-04-23 LAB — WET PREP, GENITAL
Clue Cells Wet Prep HPF POC: NONE SEEN
Sperm: NONE SEEN
Trich, Wet Prep: NONE SEEN
WBC, Wet Prep HPF POC: >=10 — AB (ref <10)
Yeast Wet Prep HPF POC: NONE SEEN

## 2021-04-23 MED ORDER — NIFEDIPINE 10 MG PO CAPS
10.0000 mg | ORAL_CAPSULE | ORAL | Status: AC | PRN
  Administered 2021-04-23 – 2021-04-24 (×3): 10 mg via ORAL
  Filled 2021-04-23 (×3): qty 1

## 2021-04-23 NOTE — MAU Provider Note (Signed)
Chief Complaint:  Abdominal Pain and Back Pain   Event Date/Time   First Provider Initiated Contact with Patient 04/23/21 2323     Contractions, leaking    HPI: Kendra Barajas is a 21 y.o. G1P0000 at [redacted]w[redacted]d by LMP who presents to maternity admissions reporting painful contractions starting last night, 04/23/21, and worsening today.  She reports watery discharge x 2 days that has not required a pad but has made her underwear wet. She has constipation with hard stools. There are no other s/sx.  She reports good fetal movement.     HPI  Past Medical History: Past Medical History:  Diagnosis Date   Anxiety    Asthma    Depression     Past obstetric history: OB History  Gravida Para Term Preterm AB Living  1 0 0 0 0 0  SAB IAB Ectopic Multiple Live Births  0 0 0 0 0    # Outcome Date GA Lbr Len/2nd Weight Sex Delivery Anes PTL Lv  1 Current             Past Surgical History: Past Surgical History:  Procedure Laterality Date   Contracted toe Right 08/2019   FOOT SURGERY     WISDOM TOOTH EXTRACTION      Family History: Family History  Problem Relation Age of Onset   High blood pressure Mother    Anxiety disorder Mother    Schizophrenia Mother    Diabetes Father    Liver disease Father    Asthma Sister    Cancer Maternal Aunt    Heart failure Other     Social History: Social History   Tobacco Use   Smoking status: Never   Smokeless tobacco: Never  Vaping Use   Vaping Use: Never used  Substance Use Topics   Alcohol use: No   Drug use: No    Allergies: No Known Allergies  Meds:  Medications Prior to Admission  Medication Sig Dispense Refill Last Dose   acetaminophen (TYLENOL) 500 MG tablet Take 1 tablet (500 mg total) by mouth every 6 (six) hours as needed. 30 tablet 0    albuterol (VENTOLIN HFA) 108 (90 Base) MCG/ACT inhaler Inhale 2 puffs into the lungs every 4 (four) hours as needed. 18 g 5    Blood Pressure Monitoring (BLOOD PRESSURE KIT) DEVI 1  Device by Does not apply route as needed. 1 each 0    docusate sodium (COLACE) 100 MG capsule Take 1 capsule (100 mg total) by mouth 2 (two) times daily as needed. 30 capsule 2    famotidine (PEPCID) 20 MG tablet Take 1 tablet (20 mg total) by mouth 2 (two) times daily. 30 tablet 0    fluticasone-salmeterol (ADVAIR) 100-50 MCG/ACT AEPB Inhale 1 puff into the lungs 2 (two) times daily. 1 each 3    Misc. Devices (GOJJI WEIGHT SCALE) MISC 1 Device by Does not apply route as needed. 1 each 0    Prenatal Vit-Fe Fumarate-FA (PREPLUS) 27-1 MG TABS Take 1 tablet by mouth daily. 30 tablet 8     ROS:  Review of Systems  Constitutional:  Negative for chills, fatigue and fever.  Eyes:  Negative for visual disturbance.  Respiratory:  Negative for shortness of breath.   Cardiovascular:  Negative for chest pain.  Gastrointestinal:  Positive for abdominal pain. Negative for nausea and vomiting.  Genitourinary:  Positive for vaginal discharge. Negative for difficulty urinating, dysuria, flank pain, pelvic pain, vaginal bleeding and vaginal pain.  Musculoskeletal:  Positive for back pain.  Neurological:  Negative for dizziness and headaches.  Psychiatric/Behavioral: Negative.      I have reviewed patient's Past Medical Hx, Surgical Hx, Family Hx, Social Hx, medications and allergies.   Physical Exam  Patient Vitals for the past 24 hrs:  BP Temp Temp src Pulse Resp SpO2 Height Weight  04/24/21 0141 (!) 116/59 98.3 F (36.8 C) Oral 85 18 100 % -- --  04/24/21 0053 (!) 112/57 -- -- -- -- -- -- --  04/24/21 0027 (!) 109/56 -- -- -- -- -- -- --  04/23/21 2357 124/74 -- -- -- -- -- -- --  04/23/21 2335 -- -- -- -- -- 100 % -- --  04/23/21 2330 -- -- -- -- -- 100 % -- --  04/23/21 2325 -- -- -- -- -- 100 % -- --  04/23/21 2250 -- -- -- -- -- 100 % -- --  04/23/21 2248 127/77 98.8 F (37.1 C) Oral 75 20 -- -- --  04/23/21 2219 113/69 98.2 F (36.8 C) -- 71 18 100 % $Rem'5\' 7"'GLUp$  (1.702 m) 68 kg    Constitutional: Well-developed, well-nourished female in no acute distress.  Cardiovascular: normal rate Respiratory: normal effort GI: Abd soft, non-tender, gravid appropriate for gestational age.  MS: Extremities nontender, no edema, normal ROM Neurologic: Alert and oriented x 4.  GU: Neg CVAT.   Dilation: Closed Effacement (%): 50 Cervical Position: Posterior Station: Ballotable Exam by:: Leftwich-Kirby CNM  FHT:  Baseline 145 , moderate variability, accelerations present, no decelerations Contractions: initially, every 2-3 minutes, mild to palpation then every 7-10 minutes after medications given   Labs: Results for orders placed or performed during the hospital encounter of 04/23/21 (from the past 24 hour(s))  Urinalysis, Routine w reflex microscopic Urine, Clean Catch     Status: Abnormal   Collection Time: 04/23/21 10:25 PM  Result Value Ref Range   Color, Urine STRAW (A) YELLOW   APPearance CLEAR CLEAR   Specific Gravity, Urine 1.003 (L) 1.005 - 1.030   pH 6.0 5.0 - 8.0   Glucose, UA NEGATIVE NEGATIVE mg/dL   Hgb urine dipstick NEGATIVE NEGATIVE   Bilirubin Urine NEGATIVE NEGATIVE   Ketones, ur NEGATIVE NEGATIVE mg/dL   Protein, ur NEGATIVE NEGATIVE mg/dL   Nitrite NEGATIVE NEGATIVE   Leukocytes,Ua NEGATIVE NEGATIVE  Fetal fibronectin     Status: None   Collection Time: 04/23/21 11:26 PM  Result Value Ref Range   Fetal Fibronectin NEGATIVE NEGATIVE  Wet prep, genital     Status: Abnormal   Collection Time: 04/23/21 11:26 PM   Specimen: Genital  Result Value Ref Range   Yeast Wet Prep HPF POC NONE SEEN NONE SEEN   Trich, Wet Prep NONE SEEN NONE SEEN   Clue Cells Wet Prep HPF POC NONE SEEN NONE SEEN   WBC, Wet Prep HPF POC >=10 (A) <10   Sperm NONE SEEN    O/Positive/-- (08/23 1638)  Imaging:  No results found.  MAU Course/MDM: Orders Placed This Encounter  Procedures   Wet prep, genital   Urinalysis, Routine w reflex microscopic Urine, Clean Catch    Fetal fibronectin   Fern Test   Discharge patient    Meds ordered this encounter  Medications   NIFEdipine (PROCARDIA) capsule 10 mg   polyethylene glycol powder (GLYCOLAX/MIRALAX) 17 GM/SCOOP powder    Sig: Take 17 g by mouth daily.    Dispense:  255 g    Refill:  0    Order Specific  Question:   Supervising Provider    Answer:   Jenna Luo T [[id number]]     NST reviewed and reactive Cervix remained closed x 2+ hours in MAU, FFN negative so no evidence of preterm labor UA and wet prep wnl Procardia 10 mg x 3 doses Q 20 minutes given with significant improvement in contractions, on toco and per pt report Pt with constipation and stool palpated on cervical exam Tx for constipation with diet changes, Colace, and Miralax PTL precautions/reasons to return reviewed F/U in office on Tuesday as scheduled    Assessment: 1. Supervision of normal first pregnancy, antepartum   2. Threatened preterm labor, third trimester   3. [redacted] weeks gestation of pregnancy   4. Constipation during pregnancy in third trimester     Plan: Discharge home Labor precautions and fetal kick counts  Follow-up [place] for [place] at [facility] Follow up.   Specialty: Obstetrics and Gynecology Why: On Tuesday, 04/28/21, as scheduled. Contact information: [address] [place] [address] [phone]        [facility] Follow up.   Specialty: Obstetrics and Gynecology Why: As needed for emergencies Contact information: [address] [id number] [place] [phone]               Allergies as of 04/24/2021   No Known Allergies      Medication List     TAKE these medications    acetaminophen 500 MG tablet Commonly known as: TYLENOL Take 1 tablet (500 mg total) by mouth every 6 (six) hours as needed.   albuterol 108 (90 Base) MCG/ACT inhaler Commonly known  as: VENTOLIN HFA Inhale 2 puffs into the lungs every 4 (four) hours as needed.   Blood Pressure Kit Devi 1 Device by Does not apply route as needed.   docusate sodium 100 MG capsule Commonly known as: COLACE Take 1 capsule (100 mg total) by mouth 2 (two) times daily as needed.   famotidine 20 MG tablet Commonly known as: PEPCID Take 1 tablet (20 mg total) by mouth 2 (two) times daily.   fluticasone-salmeterol 100-50 MCG/ACT Aepb Commonly known as: ADVAIR Inhale 1 puff into the lungs 2 (two) times daily.   Gojji Weight Scale Misc 1 Device by Does not apply route as needed.   polyethylene glycol powder 17 GM/SCOOP powder Commonly known as: GLYCOLAX/MIRALAX Take 17 g by mouth daily.   PrePLUS 27-1 MG Tabs Take 1 tablet by mouth daily.        Fatima Blank Certified Nurse-Midwife 04/24/2021 1:52 AM

## 2021-04-23 NOTE — MAU Note (Signed)
Having some lower abd pains and lower back pains since this am. Denies LOF or VB. Good FM.

## 2021-04-24 DIAGNOSIS — O4703 False labor before 37 completed weeks of gestation, third trimester: Secondary | ICD-10-CM

## 2021-04-24 DIAGNOSIS — Z3A32 32 weeks gestation of pregnancy: Secondary | ICD-10-CM

## 2021-04-24 DIAGNOSIS — O99613 Diseases of the digestive system complicating pregnancy, third trimester: Secondary | ICD-10-CM

## 2021-04-24 DIAGNOSIS — K5900 Constipation, unspecified: Secondary | ICD-10-CM

## 2021-04-24 LAB — GC/CHLAMYDIA PROBE AMP (~~LOC~~) NOT AT ARMC
Chlamydia: NEGATIVE
Comment: NEGATIVE
Comment: NORMAL
Neisseria Gonorrhea: NEGATIVE

## 2021-04-24 LAB — FETAL FIBRONECTIN: Fetal Fibronectin: NEGATIVE

## 2021-04-24 MED ORDER — POLYETHYLENE GLYCOL 3350 17 GM/SCOOP PO POWD: 17.0000 g | Freq: Every day | ORAL | 0 refills | Status: DC

## 2021-04-24 NOTE — Discharge Instructions (Signed)
Reasons to return to MAU at [facility]:  Since you are preterm, return to MAU if:  1.  Contractions are 10 minutes apart or less and they becoming more uncomfortable or painful over time 2.  You have a large gush of fluid, or a trickle of fluid that will not stop and you have to wear a pad 3.  You have bleeding that is bright red, heavier than spotting--like menstrual bleeding (spotting can be normal in early labor or after a check of your cervix) 4.  You do not feel the baby moving like he/she normally does  

## 2021-04-28 ENCOUNTER — Ambulatory Visit (INDEPENDENT_AMBULATORY_CARE_PROVIDER_SITE_OTHER): Payer: Medicaid Other | Admitting: Family Medicine

## 2021-04-28 ENCOUNTER — Other Ambulatory Visit: Payer: Self-pay

## 2021-04-28 VITALS — BP 110/72 | HR 66 | Wt 150.4 lb

## 2021-04-28 DIAGNOSIS — O99613 Diseases of the digestive system complicating pregnancy, third trimester: Secondary | ICD-10-CM

## 2021-04-28 DIAGNOSIS — K5900 Constipation, unspecified: Secondary | ICD-10-CM

## 2021-04-28 DIAGNOSIS — Z3400 Encounter for supervision of normal first pregnancy, unspecified trimester: Secondary | ICD-10-CM

## 2021-04-28 MED ORDER — POLYETHYLENE GLYCOL 3350 17 GM/SCOOP PO POWD: 17.0000 g | Freq: Every day | ORAL | 5 refills | Status: DC

## 2021-04-28 NOTE — Progress Notes (Signed)
° °  Subjective:  Kendra Barajas is a 21 y.o. G1P0000 at [redacted]w[redacted]d being seen today for ongoing prenatal care.  She is currently monitored for the following issues for this low-risk pregnancy and has Atopic dermatitis; Vitamin D deficiency; Supervision of normal first pregnancy, antepartum; and Constipation during pregnancy on their problem list.  Patient reports no complaints.  Contractions: Irregular. Vag. Bleeding: None.  Movement: Present. Denies leaking of fluid.   The following portions of the patient's history were reviewed and updated as appropriate: allergies, current medications, past family history, past medical history, past social history, past surgical history and problem list. Problem list updated.  Objective:   Vitals:   04/28/21 1105  BP: 110/72  Pulse: 66  Weight: 150 lb 6.4 oz (68.2 kg)    Fetal Status: Fetal Heart Rate (bpm): 136   Movement: Present     General:  Alert, oriented and cooperative. Patient is in no acute distress.  Skin: Skin is warm and dry. No rash noted.   Cardiovascular: Normal heart rate noted  Respiratory: Normal respiratory effort, no problems with respiration noted  Abdomen: Soft, gravid, appropriate for gestational age. Pain/Pressure: Present     Pelvic: Vag. Bleeding: None     Cervical exam deferred        Extremities: Normal range of motion.  Edema: None  Mental Status: Normal mood and affect. Normal behavior. Normal judgment and thought content.   Urinalysis:      Assessment and Plan:  Pregnancy: G1P0000 at [redacted]w[redacted]d  1. Supervision of normal first pregnancy, antepartum BP and FHR normal  2. Constipation during pregnancy in third trimester Significant symptoms, counseled on dietary changes, miralax use  Preterm labor symptoms and general obstetric precautions including but not limited to vaginal bleeding, contractions, leaking of fluid and fetal movement were reviewed in detail with the patient. Please refer to After Visit Summary for other  counseling recommendations.  Return in 2 weeks (on 05/12/2021) for Dyad patient, ob visit.   Clarnce Flock, MD

## 2021-04-28 NOTE — Patient Instructions (Signed)

## 2021-05-13 ENCOUNTER — Ambulatory Visit (INDEPENDENT_AMBULATORY_CARE_PROVIDER_SITE_OTHER): Payer: Medicaid Other | Admitting: Family Medicine

## 2021-05-13 ENCOUNTER — Other Ambulatory Visit: Payer: Self-pay

## 2021-05-13 VITALS — BP 114/75 | HR 71 | Wt 151.6 lb

## 2021-05-13 DIAGNOSIS — O99613 Diseases of the digestive system complicating pregnancy, third trimester: Secondary | ICD-10-CM

## 2021-05-13 DIAGNOSIS — Z3400 Encounter for supervision of normal first pregnancy, unspecified trimester: Secondary | ICD-10-CM

## 2021-05-13 DIAGNOSIS — K5900 Constipation, unspecified: Secondary | ICD-10-CM

## 2021-05-13 NOTE — Patient Instructions (Signed)

## 2021-05-13 NOTE — Progress Notes (Signed)
° ° °  Subjective:  Kendra Barajas is a 21 y.o. G1P0000 at [redacted]w[redacted]d being seen today for ongoing prenatal care.  She is currently monitored for the following issues for this low-risk pregnancy and has Atopic dermatitis; Vitamin D deficiency; Supervision of normal first pregnancy, antepartum; and Constipation during pregnancy on their problem list.  Patient reports no complaints.  Contractions: Irritability. Vag. Bleeding: None.  Movement: Present. Denies leaking of fluid.   The following portions of the patient's history were reviewed and updated as appropriate: allergies, current medications, past family history, past medical history, past social history, past surgical history and problem list. Problem list updated.  Objective:   Vitals:   05/13/21 1100  BP: 114/75  Pulse: 71  Weight: 151 lb 9.6 oz (68.8 kg)    Fetal Status: Fetal Heart Rate (bpm): 145 Fundal Height: 35 cm Movement: Present     General:  Alert, oriented and cooperative. Patient is in no acute distress.  Skin: Skin is warm and dry. No rash noted.   Cardiovascular: Normal heart rate noted  Respiratory: Normal respiratory effort, no problems with respiration noted  Abdomen: Soft, gravid, appropriate for gestational age. Pain/Pressure: Present     Pelvic: Vag. Bleeding: None     Cervical exam deferred        Extremities: Normal range of motion.  Edema: None  Mental Status: Normal mood and affect. Normal behavior. Normal judgment and thought content.   Urinalysis:      Assessment and Plan:  Pregnancy: G1P0000 at [redacted]w[redacted]d  1. Supervision of normal first pregnancy, antepartum BP and FHR normal Swabs at next visit Lots of stress due to partner denying his paternity   Preterm labor symptoms and general obstetric precautions including but not limited to vaginal bleeding, contractions, leaking of fluid and fetal movement were reviewed in detail with the patient. Please refer to After Visit Summary for other counseling  recommendations.  Return in 1 week (on 05/20/2021) for Dyad patient, ob visit.   Clarnce Flock, MD

## 2021-05-22 ENCOUNTER — Other Ambulatory Visit (HOSPITAL_COMMUNITY)
Admission: RE | Admit: 2021-05-22 | Discharge: 2021-05-22 | Disposition: A | Discharge status: Home / Self Care | Payer: Medicaid Other | Source: Ambulatory Visit | Attending: Family Medicine | Admitting: Family Medicine

## 2021-05-22 ENCOUNTER — Ambulatory Visit (INDEPENDENT_AMBULATORY_CARE_PROVIDER_SITE_OTHER): Payer: Medicaid Other | Admitting: Family Medicine

## 2021-05-22 ENCOUNTER — Other Ambulatory Visit: Payer: Self-pay

## 2021-05-22 VITALS — BP 126/82 | HR 78 | Wt 156.3 lb

## 2021-05-22 DIAGNOSIS — K5900 Constipation, unspecified: Secondary | ICD-10-CM

## 2021-05-22 DIAGNOSIS — O99613 Diseases of the digestive system complicating pregnancy, third trimester: Secondary | ICD-10-CM

## 2021-05-22 DIAGNOSIS — Z3400 Encounter for supervision of normal first pregnancy, unspecified trimester: Secondary | ICD-10-CM

## 2021-05-22 NOTE — Progress Notes (Signed)
° ° °  Subjective:  Kendra Barajas is a 21 y.o. G1P0000 at 107w6d being seen today for ongoing prenatal care.  She is currently monitored for the following issues for this low-risk pregnancy and has Atopic dermatitis; Vitamin D deficiency; Supervision of normal first pregnancy, antepartum; and Constipation during pregnancy on their problem list.  Patient reports no complaints.  Contractions: Irritability. Vag. Bleeding: None.  Movement: Present. Denies leaking of fluid.   The following portions of the patient's history were reviewed and updated as appropriate: allergies, current medications, past family history, past medical history, past social history, past surgical history and problem list. Problem list updated.  Objective:   Vitals:   05/22/21 1010  BP: 126/82  Pulse: 78  Weight: 156 lb 4.8 oz (70.9 kg)    Fetal Status: Fetal Heart Rate (bpm): 143   Movement: Present  Presentation: Vertex  General:  Alert, oriented and cooperative. Patient is in no acute distress.  Skin: Skin is warm and dry. No rash noted.   Cardiovascular: Normal heart rate noted  Respiratory: Normal respiratory effort, no problems with respiration noted  Abdomen: Soft, gravid, appropriate for gestational age. Pain/Pressure: Present     Pelvic: Vag. Bleeding: None     Cervical exam performed Dilation: Closed   Station: -2  Extremities: Normal range of motion.  Edema: None  Mental Status: Normal mood and affect. Normal behavior. Normal judgment and thought content.   Urinalysis:      Assessment and Plan:  Pregnancy: G1P0000 at [redacted]w[redacted]d  1. Supervision of normal first pregnancy, antepartum BP and FHR normal Swabs today - GC/Chlamydia probe amp ([place])not at [facility] - Culture, beta strep (group b only)    Preterm labor symptoms and general obstetric precautions including but not limited to vaginal bleeding, contractions, leaking of fluid and fetal movement were reviewed in detail with the patient. Please  refer to After Visit Summary for other counseling recommendations.  Return in 1 week (on 05/29/2021) for Dyad patient, ob visit.   Clarnce Flock, MD

## 2021-05-22 NOTE — Patient Instructions (Signed)

## 2021-05-25 LAB — GC/CHLAMYDIA PROBE AMP (~~LOC~~) NOT AT ARMC
Chlamydia: NEGATIVE
Comment: NEGATIVE
Comment: NORMAL
Neisseria Gonorrhea: NEGATIVE

## 2021-05-26 LAB — CULTURE, BETA STREP (GROUP B ONLY): Strep Gp B Culture: NEGATIVE

## 2021-05-29 ENCOUNTER — Other Ambulatory Visit: Payer: Self-pay

## 2021-05-29 ENCOUNTER — Ambulatory Visit (INDEPENDENT_AMBULATORY_CARE_PROVIDER_SITE_OTHER): Payer: Medicaid Other | Admitting: Family Medicine

## 2021-05-29 VITALS — BP 121/76 | HR 74 | Wt 159.0 lb

## 2021-05-29 DIAGNOSIS — Z3400 Encounter for supervision of normal first pregnancy, unspecified trimester: Secondary | ICD-10-CM

## 2021-05-29 DIAGNOSIS — K5900 Constipation, unspecified: Secondary | ICD-10-CM

## 2021-05-29 DIAGNOSIS — O99613 Diseases of the digestive system complicating pregnancy, third trimester: Secondary | ICD-10-CM

## 2021-05-29 NOTE — Patient Instructions (Signed)

## 2021-05-29 NOTE — Progress Notes (Signed)
? ?  Subjective:  ?Kendra Barajas is a 21 y.o. G1P0000 at [redacted]w[redacted]d being seen today for ongoing prenatal care.  She is currently monitored for the following issues for this low-risk pregnancy and has Atopic dermatitis; Vitamin D deficiency; Supervision of normal first pregnancy, antepartum; and Constipation during pregnancy on their problem list. ? ?Patient reports no complaints.  Contractions: Irritability. Vag. Bleeding: None.  Movement: Present. Denies leaking of fluid.  ? ?The following portions of the patient's history were reviewed and updated as appropriate: allergies, current medications, past family history, past medical history, past social history, past surgical history and problem list. Problem list updated. ? ?Objective:  ? ?Vitals:  ? 05/29/21 0927  ?BP: 121/76  ?Pulse: 74  ?Weight: 159 lb (72.1 kg)  ? ? ?Fetal Status: Fetal Heart Rate (bpm): 147   Movement: Present  Presentation: Vertex ? ?General:  Alert, oriented and cooperative. Patient is in no acute distress.  ?Skin: Skin is warm and dry. No rash noted.   ?Cardiovascular: Normal heart rate noted  ?Respiratory: Normal respiratory effort, no problems with respiration noted  ?Abdomen: Soft, gravid, appropriate for gestational age. Pain/Pressure: Present     ?Pelvic: Vag. Bleeding: None     ?Cervical exam performed Dilation: Closed   Station: -2  ?Extremities: Normal range of motion.  Edema: None  ?Mental Status: Normal mood and affect. Normal behavior. Normal judgment and thought content.  ? ?Urinalysis:     ? ?Assessment and Plan:  ?Pregnancy: G1P0000 at [redacted]w[redacted]d ? ?1. Supervision of normal first pregnancy, antepartum ?BP and FHR normal ?Cervix checked, still closed ?Discussed natural IOL methods ? ?2. Constipation during pregnancy in third trimester ? ? ?Term labor symptoms and general obstetric precautions including but not limited to vaginal bleeding, contractions, leaking of fluid and fetal movement were reviewed in detail with the patient. ?Please  refer to After Visit Summary for other counseling recommendations.  ?Return in 1 week (on 06/05/2021) for Dyad patient, ob visit. ? ? ?Clarnce Flock, MD ? ?

## 2021-06-01 ENCOUNTER — Encounter: Payer: Self-pay | Admitting: Family Medicine

## 2021-06-01 MED ORDER — TERCONAZOLE 0.4 % VA CREA: 1.0000 | TOPICAL_CREAM | Freq: Every day | VAGINAL | 0 refills | Status: DC

## 2021-06-03 ENCOUNTER — Inpatient Hospital Stay (EMERGENCY_DEPARTMENT_HOSPITAL)
Admission: AD | Admit: 2021-06-03 | Discharge: 2021-06-03 | Disposition: A | Discharge status: Home / Self Care | Payer: Medicaid Other | Source: Home / Self Care | Attending: Obstetrics and Gynecology | Admitting: Obstetrics and Gynecology

## 2021-06-03 ENCOUNTER — Inpatient Hospital Stay (HOSPITAL_COMMUNITY)
Admission: AD | Admit: 2021-06-03 | Discharge: 2021-06-06 | DRG: 807 | Disposition: A | Discharge status: Home / Self Care | Payer: Medicaid Other | Attending: Obstetrics & Gynecology | Admitting: Obstetrics & Gynecology

## 2021-06-03 ENCOUNTER — Encounter (HOSPITAL_COMMUNITY): Payer: Self-pay | Admitting: Obstetrics and Gynecology

## 2021-06-03 ENCOUNTER — Encounter (HOSPITAL_COMMUNITY): Payer: Self-pay | Admitting: Obstetrics & Gynecology

## 2021-06-03 ENCOUNTER — Other Ambulatory Visit: Payer: Self-pay

## 2021-06-03 DIAGNOSIS — Z3A38 38 weeks gestation of pregnancy: Secondary | ICD-10-CM

## 2021-06-03 DIAGNOSIS — Z20822 Contact with and (suspected) exposure to covid-19: Secondary | ICD-10-CM | POA: Diagnosis present

## 2021-06-03 DIAGNOSIS — O479 False labor, unspecified: Secondary | ICD-10-CM

## 2021-06-03 DIAGNOSIS — Z3689 Encounter for other specified antenatal screening: Secondary | ICD-10-CM | POA: Insufficient documentation

## 2021-06-03 DIAGNOSIS — Z3403 Encounter for supervision of normal first pregnancy, third trimester: Secondary | ICD-10-CM | POA: Insufficient documentation

## 2021-06-03 DIAGNOSIS — O134 Gestational [pregnancy-induced] hypertension without significant proteinuria, complicating childbirth: Secondary | ICD-10-CM | POA: Diagnosis present

## 2021-06-03 DIAGNOSIS — R030 Elevated blood-pressure reading, without diagnosis of hypertension: Secondary | ICD-10-CM | POA: Diagnosis present

## 2021-06-03 DIAGNOSIS — O139 Gestational [pregnancy-induced] hypertension without significant proteinuria, unspecified trimester: Secondary | ICD-10-CM | POA: Diagnosis present

## 2021-06-03 DIAGNOSIS — Z3400 Encounter for supervision of normal first pregnancy, unspecified trimester: Principal | ICD-10-CM

## 2021-06-03 LAB — COMPREHENSIVE METABOLIC PANEL
ALT: 14 U/L (ref 0–44)
AST: 30 U/L (ref 15–41)
Albumin: 2.9 g/dL — ABNORMAL LOW (ref 3.5–5.0)
Alkaline Phosphatase: 107 U/L (ref 38–126)
Anion gap: 9 (ref 5–15)
BUN: 5 mg/dL — ABNORMAL LOW (ref 6–20)
CO2: 20 mmol/L — ABNORMAL LOW (ref 22–32)
Calcium: 8.8 mg/dL — ABNORMAL LOW (ref 8.9–10.3)
Chloride: 105 mmol/L (ref 98–111)
Creatinine, Ser: 0.72 mg/dL (ref 0.44–1.00)
GFR, Estimated: >60 mL/min (ref >60)
Glucose, Bld: 86 mg/dL (ref 70–99)
Potassium: 3.3 mmol/L — ABNORMAL LOW (ref 3.5–5.1)
Sodium: 134 mmol/L — ABNORMAL LOW (ref 135–145)
Total Bilirubin: 0.4 mg/dL (ref 0.3–1.2)
Total Protein: 6 g/dL — ABNORMAL LOW (ref 6.5–8.1)

## 2021-06-03 LAB — CBC
HCT: 30.9 % — ABNORMAL LOW (ref 36.0–46.0)
Hemoglobin: 10.2 g/dL — ABNORMAL LOW (ref 12.0–15.0)
MCH: 30.6 pg (ref 26.0–34.0)
MCHC: 33 g/dL (ref 30.0–36.0)
MCV: 92.8 fL (ref 80.0–100.0)
Platelets: 121 10*3/uL — ABNORMAL LOW (ref 150–400)
RBC: 3.33 MIL/uL — ABNORMAL LOW (ref 3.87–5.11)
RDW: 13.4 % (ref 11.5–15.5)
WBC: 7.4 10*3/uL (ref 4.0–10.5)
nRBC: 0 % (ref 0.0–0.2)

## 2021-06-03 LAB — PROTEIN / CREATININE RATIO, URINE
Creatinine, Urine: 50.87 mg/dL
Total Protein, Urine: <6 mg/dL

## 2021-06-03 LAB — POCT FERN TEST: POCT Fern Test: NEGATIVE

## 2021-06-03 MED ORDER — ACETAMINOPHEN 325 MG PO TABS: 650.0000 mg | ORAL_TABLET | ORAL | Status: DC | PRN

## 2021-06-03 MED ORDER — HYDRALAZINE HCL 20 MG/ML IJ SOLN: 10.0000 mg | INTRAMUSCULAR | Status: DC | PRN

## 2021-06-03 MED ORDER — ONDANSETRON HCL 4 MG/2ML IJ SOLN
4.0000 mg | Freq: Four times a day (QID) | INTRAMUSCULAR | Status: DC | PRN
  Administered 2021-06-04: 14:00:00 4 mg via INTRAVENOUS
  Filled 2021-06-03: qty 2

## 2021-06-03 MED ORDER — OXYTOCIN BOLUS FROM INFUSION
333.0000 mL | Freq: Once | INTRAVENOUS | Status: AC
  Administered 2021-06-04: 13:00:00 333 mL via INTRAVENOUS

## 2021-06-03 MED ORDER — OXYCODONE-ACETAMINOPHEN 5-325 MG PO TABS: 1.0000 | ORAL_TABLET | ORAL | Status: DC | PRN

## 2021-06-03 MED ORDER — LACTATED RINGERS IV SOLN: 500.0000 mL | INTRAVENOUS | Status: DC | PRN

## 2021-06-03 MED ORDER — LABETALOL HCL 5 MG/ML IV SOLN: 40.0000 mg | INTRAVENOUS | Status: DC | PRN

## 2021-06-03 MED ORDER — LACTATED RINGERS IV SOLN: INTRAVENOUS | Status: DC

## 2021-06-03 MED ORDER — OXYTOCIN-SODIUM CHLORIDE 30-0.9 UT/500ML-% IV SOLN
2.5000 [IU]/h | INTRAVENOUS | Status: DC
  Filled 2021-06-03: qty 500

## 2021-06-03 MED ORDER — LABETALOL HCL 5 MG/ML IV SOLN: 80.0000 mg | INTRAVENOUS | Status: DC | PRN

## 2021-06-03 MED ORDER — SOD CITRATE-CITRIC ACID 500-334 MG/5ML PO SOLN: 30.0000 mL | ORAL | Status: DC | PRN

## 2021-06-03 MED ORDER — OXYCODONE-ACETAMINOPHEN 5-325 MG PO TABS: 2.0000 | ORAL_TABLET | ORAL | Status: DC | PRN

## 2021-06-03 MED ORDER — LIDOCAINE HCL (PF) 1 % IJ SOLN: 30.0000 mL | INTRAMUSCULAR | Status: DC | PRN

## 2021-06-03 MED ORDER — LABETALOL HCL 5 MG/ML IV SOLN: 20.0000 mg | INTRAVENOUS | Status: DC | PRN

## 2021-06-03 NOTE — Discharge Instructions (Signed)
Reasons to return to MAU at [facility]:  1.  Contractions are  5 minutes apart or less, each last 1 minute, these have been going on for 1-2 hours, and you cannot walk or talk during them 2.  You have a large gush of fluid, or a trickle of fluid that will not stop and you have to wear a pad 3.  You have bleeding that is bright red, heavier than spotting--like menstrual bleeding (spotting can be normal in early labor or after a check of your cervix) 4.  You do not feel the baby moving like he/she normally does  

## 2021-06-03 NOTE — MAU Provider Note (Signed)
?History  ?  ? ?CSN: [id number] ? ?Arrival date and time: 06/03/21 2117 ? ? None  ?  ? ?Chief Complaint  ?Patient presents with  ? Contractions  ? Vaginal Discharge  ? ?HPI ?Kendra Barajas is a 21 y.o. G1P0 at 76w4dwho presents to MAU for contractions. Patient reports ongoing contractions but worsened today. Reports every few minutes. She denies leaking fluid or bleeding, only having some bloody show. Denies headache, vision changes, or RUQ/epigastric pain. Endorses active fetal movement. ?[facility] [place] [facility]  ? ?OB History   ? ? Gravida  ?1  ? Para  ?0  ? Term  ?0  ? Preterm  ?0  ? AB  ?0  ? Living  ?0  ?  ? ? SAB  ?0  ? IAB  ?0  ? Ectopic  ?0  ? Multiple  ?0  ? Live Births  ?0  ?   ?  ?  ? ? ?Past Medical History:  ?Diagnosis Date  ? Anxiety   ? Asthma   ? Depression   ? ? ?Past Surgical History:  ?Procedure Laterality Date  ? Contracted toe Right 08/2019  ? FOOT SURGERY    ? WISDOM TOOTH EXTRACTION    ? ? ?Family History  ?Problem Relation Age of Onset  ? High blood pressure Mother   ? Anxiety disorder Mother   ? Schizophrenia Mother   ? Diabetes Father   ? Liver disease Father   ? Asthma Sister   ? Cancer Maternal Aunt   ? Heart failure Other   ? ? ?Social History  ? ?Tobacco Use  ? Smoking status: Never  ? Smokeless tobacco: Never  ?Vaping Use  ? Vaping Use: Never used  ?Substance Use Topics  ? Alcohol use: No  ? Drug use: No  ? ? ?Allergies: No Known Allergies ? ?Medications Prior to Admission  ?Medication Sig Dispense Refill Last Dose  ? albuterol (VENTOLIN HFA) 108 (90 Base) MCG/ACT inhaler Inhale 2 puffs into the lungs every 4 (four) hours as needed. 18 g 5 06/02/2021  ? fluticasone-salmeterol (ADVAIR) 100-50 MCG/ACT AEPB Inhale 1 puff into the lungs 2 (two) times daily. 1 each 3 06/02/2021  ? Prenatal Vit-Fe Fumarate-FA (PREPLUS) 27-1 MG TABS Take 1 tablet by mouth daily. 30 tablet 8 06/03/2021  ? terconazole (TERAZOL 7) 0.4 % vaginal cream Place 1 applicator vaginally  at bedtime. 45 g 0 Past Week  ? acetaminophen (TYLENOL) 500 MG tablet Take 1 tablet (500 mg total) by mouth every 6 (six) hours as needed. 30 tablet 0   ? Blood Pressure Monitoring (BLOOD PRESSURE KIT) DEVI 1 Device by Does not apply route as needed. 1 each 0   ? docusate sodium (COLACE) 100 MG capsule Take 1 capsule (100 mg total) by mouth 2 (two) times daily as needed. 30 capsule 2 More than a month  ? famotidine (PEPCID) 20 MG tablet Take 1 tablet (20 mg total) by mouth 2 (two) times daily. 30 tablet 0 More than a month  ? Misc. Devices (GOJJI WEIGHT SCALE) MISC 1 Device by Does not apply route as needed. 1 each 0   ? polyethylene glycol powder (GLYCOLAX/MIRALAX) 17 GM/SCOOP powder Take 17 g by mouth daily. 507 g 5 More than a month  ? ? ?Review of Systems  ?Constitutional: Negative.   ?Respiratory: Negative.    ?Gastrointestinal:  Positive for abdominal pain (contractions).  ?Genitourinary: Negative.   ?Musculoskeletal: Negative.   ?Neurological: Negative.   ?  Physical Exam  ? ?Patient Vitals for the past 24 hrs: ? BP Temp Temp src Pulse Resp SpO2 Height Weight  ?06/03/21 2303 (!) 173/91 -- -- 68 -- -- -- --  ?06/03/21 2217 (!) 158/96 -- -- 62 -- -- -- --  ?06/03/21 2152 -- 98.8 ?F (37.1 ?C) Oral -- -- -- -- --  ?06/03/21 2134 (!) 148/83 -- -- -- -- -- -- --  ?06/03/21 2133 -- 98.5 ?F (36.9 ?C) -- 63 17 100 % 5' 7" (1.702 m) 73.9 kg  ? ? ?Physical Exam ?Vitals and nursing note reviewed. Exam conducted with a chaperone present.  ?Constitutional:   ?   General: She is not in acute distress. ?Eyes:  ?   Extraocular Movements: Extraocular movements intact.  ?   Pupils: Pupils are equal, round, and reactive to light.  ?Cardiovascular:  ?   Rate and Rhythm: Normal rate.  ?Pulmonary:  ?   Effort: Pulmonary effort is normal.  ?Abdominal:  ?   Palpations: Abdomen is soft.  ?   Tenderness: There is no abdominal tenderness.  ?   Comments: gravid  ?Genitourinary: ?   Comments: VE 2/80/-2 > 4/90/-2 BBOW ?Musculoskeletal:      ?   General: Normal range of motion.  ?   Cervical back: Normal range of motion.  ?Skin: ?   General: Skin is warm and dry.  ?Neurological:  ?   General: No focal deficit present.  ?   Mental Status: She is alert and oriented to person, place, and time.  ?Psychiatric:     ?   Mood and Affect: Mood normal.     ?   Behavior: Behavior normal.     ?   Thought Content: Thought content normal.     ?   Judgment: Judgment normal.  ? ?NST ?FHR: 140 bpm, moderate variability, +15x15 accels, no decels ?Toco: Q 3-41mns ? ?MAU Course  ?Procedures ? ?MDM ?Cervix changed 2 > 4cm  ?New onset elevated BP, no severe features. Labs pending. Labetalol protocol ordered prn ? ?Assessment and Plan  ?[redacted] weeks gestation ?New onset elevated blood pressure ? ?- Admit to L&D ?- L&D team notified and to place admit orders ? ? ?DRenee Harder CNM ?06/03/2021, 11:13 PM  ?

## 2021-06-03 NOTE — H&P (Addendum)
OBSTETRIC ADMISSION HISTORY AND PHYSICAL ? ?Kendra Barajas is a 21 y.o. G1P0000 at [redacted]w[redacted]d by LMP presenting for SOL complicated by new onset high blood pressures. She reports +FMs, no LOF, no VB, no blurry vision, headaches, peripheral edema, or RUQ pain.  She plans on breast and bottle feeding. She requests Depo for birth control postpartum.  ? ?She received her prenatal care at [facility]. ? ?The patient's contractions started on Sunday 3/5. She came to the MAU yesterday and was sent home. Afterwards, her contractions intensified and she noticed some bloody show and she returned to the MAU, from which she was admitted. She states she is scared because this is her first delivery. ? ?Dating: By LMP --->  Estimated Date of Delivery: 06/13/21 ? ?Sono:   ?$'@[redacted]w[redacted]d'r$ , CWD, normal anatomy, breech presentation, anterior lie, 322g, 82% EFW ? ?Prenatal History/Complications: ?-None   ? ?Past Medical History: ?Past Medical History:  ?Diagnosis Date  ? Anxiety   ? Asthma   ? Depression   ? ? ?Past Surgical History: ?Past Surgical History:  ?Procedure Laterality Date  ? Contracted toe Right 08/2019  ? FOOT SURGERY    ? WISDOM TOOTH EXTRACTION    ? ? ?Obstetrical History: ?OB History   ? ? Gravida  ?1  ? Para  ?0  ? Term  ?0  ? Preterm  ?0  ? AB  ?0  ? Living  ?0  ?  ? ? SAB  ?0  ? IAB  ?0  ? Ectopic  ?0  ? Multiple  ?0  ? Live Births  ?0  ?   ?  ?  ? ? ?Social History ?Social History  ? ?Socioeconomic History  ? Marital status: Single  ?  Spouse name: Not on file  ? Number of children: Not on file  ? Years of education: Not on file  ? Highest education level: Not on file  ?Occupational History  ? Not on file  ?Tobacco Use  ? Smoking status: Never  ? Smokeless tobacco: Never  ?Vaping Use  ? Vaping Use: Never used  ?Substance and Sexual Activity  ? Alcohol use: No  ? Drug use: No  ? Sexual activity: Yes  ?Other Topics Concern  ? Not on file  ?Social History Narrative  ? Not on file  ? ?Social Determinants of Health  ? ?Financial Resource  Strain: Not on file  ?Food Insecurity: No Food Insecurity  ? Worried About Charity fundraiser in the Last Year: Never true  ? Ran Out of Food in the Last Year: Never true  ?Transportation Needs: No Transportation Needs  ? Lack of Transportation (Medical): No  ? Lack of Transportation (Non-Medical): No  ?Physical Activity: Not on file  ?Stress: Not on file  ?Social Connections: Not on file  ? ? ?Family History: ?Family History  ?Problem Relation Age of Onset  ? High blood pressure Mother   ? Anxiety disorder Mother   ? Schizophrenia Mother   ? Diabetes Father   ? Liver disease Father   ? Asthma Sister   ? Cancer Maternal Aunt   ? Heart failure Other   ? ? ?Allergies: ?No Known Allergies ? ?Medications Prior to Admission  ?Medication Sig Dispense Refill Last Dose  ? albuterol (VENTOLIN HFA) 108 (90 Base) MCG/ACT inhaler Inhale 2 puffs into the lungs every 4 (four) hours as needed. 18 g 5 06/02/2021  ? fluticasone-salmeterol (ADVAIR) 100-50 MCG/ACT AEPB Inhale 1 puff into the lungs 2 (two) times daily. 1  each 3 06/02/2021  ? Prenatal Vit-Fe Fumarate-FA (PREPLUS) 27-1 MG TABS Take 1 tablet by mouth daily. 30 tablet 8 06/03/2021  ? terconazole (TERAZOL 7) 0.4 % vaginal cream Place 1 applicator vaginally at bedtime. 45 g 0 Past Week  ? acetaminophen (TYLENOL) 500 MG tablet Take 1 tablet (500 mg total) by mouth every 6 (six) hours as needed. 30 tablet 0   ? Blood Pressure Monitoring (BLOOD PRESSURE KIT) DEVI 1 Device by Does not apply route as needed. 1 each 0   ? docusate sodium (COLACE) 100 MG capsule Take 1 capsule (100 mg total) by mouth 2 (two) times daily as needed. 30 capsule 2 More than a month  ? famotidine (PEPCID) 20 MG tablet Take 1 tablet (20 mg total) by mouth 2 (two) times daily. 30 tablet 0 More than a month  ? Misc. Devices (GOJJI WEIGHT SCALE) MISC 1 Device by Does not apply route as needed. 1 each 0   ? polyethylene glycol powder (GLYCOLAX/MIRALAX) 17 GM/SCOOP powder Take 17 g by mouth daily. 507 g 5 More  than a month  ? ? ? ?Review of Systems  ?All systems reviewed and negative except as stated in HPI ? ?Blood pressure (!) 153/78, pulse 61, temperature 98.8 ?F (37.1 ?C), temperature source Oral, resp. rate 16, height $RemoveBe'5\' 7"'gKDPQEOlB$  (1.702 m), weight 73.9 kg, last menstrual period 09/06/2020, SpO2 100 %. ? ?General appearance: alert, cooperative, and no distress ?Lungs: normal work of breathing on room air ?Heart: normal rate, warm and well perfused  ?Extremities: Homans sign is negative, no sign of DVT ? ?Presentation: Cephalic ?Fetal monitoring: Baseline: 140 bpm, Variability: Good {> 6 bpm), Accelerations: Reactive, and Decelerations: Absent ?Uterine activity: Frequency: Every 3-4 minutes ?Dilation: 2 ?Effacement (%): 80 ?Station: -2 ?Exam by:: Linton Rump, RN ? ? ?Prenatal labs: ?ABO, Rh: O/Positive/-- (08/23 1638) ?Antibody: Negative (08/23 1638) ?Rubella: 10.90 (08/23 1638) ?RPR: Non Reactive (12/16 0911)  ?HBsAg: Negative (08/23 1638)  ?HIV: Non Reactive (12/16 0911)  ?GBS: Negative/-- (02/24 1104)  ?2 hr Glucola: Normal ?Genetic screening: NIPS low risk/AFP normal/Horizon negative ?Anatomy US: Normal ? ?Prenatal Transfer Tool  ?Maternal Diabetes: No ?Genetic Screening: Normal ?Maternal Ultrasounds/Referrals: Normal ?Fetal Ultrasounds or other Referrals:  None ?Maternal Substance Abuse:  No ?Significant Maternal Medications:  None ?Significant Maternal Lab Results: Group B Strep negative ? ?Results for orders placed or performed during the hospital encounter of 06/03/21 (from the past 24 hour(s))  ?Protein / creatinine ratio, urine  ? Collection Time: 06/03/21  9:40 PM  ?Result Value Ref Range  ? Creatinine, Urine 50.87 mg/dL  ? Total Protein, Urine <6 mg/dL  ? Protein Creatinine Ratio        0.00 - 0.15 mg/mg[Cre]  ?CBC  ? Collection Time: 06/03/21 10:43 PM  ?Result Value Ref Range  ? WBC 7.4 4.0 - 10.5 K/uL  ? RBC 3.33 (L) 3.87 - 5.11 MIL/uL  ? Hemoglobin 10.2 (L) 12.0 - 15.0 g/dL  ? HCT 30.9 (L) 36.0 - 46.0 %  ?  MCV 92.8 80.0 - 100.0 fL  ? MCH 30.6 26.0 - 34.0 pg  ? MCHC 33.0 30.0 - 36.0 g/dL  ? RDW 13.4 11.5 - 15.5 %  ? Platelets 121 (L) 150 - 400 K/uL  ? nRBC 0.0 0.0 - 0.2 %  ?Results for orders placed or performed during the hospital encounter of 06/03/21 (from the past 24 hour(s))  ?POCT fern test  ? Collection Time: 06/03/21 12:53 PM  ?Result Value Ref Range  ?  POCT Fern Test Negative = intact amniotic membranes   ? ? ?Patient Active Problem List  ? Diagnosis Date Noted  ? Constipation during pregnancy 02/11/2021  ? Supervision of normal first pregnancy, antepartum 11/18/2020  ? Vitamin D deficiency 01/22/2020  ? Atopic dermatitis 11/23/2017  ? ? ?Assessment/Plan:  ?Kendra Barajas is a 22 y.o. G1P0000 at [redacted]w[redacted]d here for SOL complicated by new onset high blood pressure. ? ?#Labor: SOL and making change in MAU with bulging bag. Continues to have regular painful contractions. Will continue expectant management and reassess in 4 hours. ?#Pain: Planning for epidural; IV Fentanyl PRN prior to this as desired  ?#FWB: Category 1 ?#ID:  GBS negative ?#MOF: Both ?#MOC: Depo ?#Circ:  Yes, inpatient ? ?#gHTN: Mild range BP overall, only severe range x1 that normalized without treatment. No symptoms. Pre-E labs within normal limits. Will continue to monitor closely.  ? ?Dimitry Mining engineer, Medical Student  ?06/03/2021, 11:13 PM ? ?GME ATTESTATION:  ?I saw and evaluated the patient. I agree with the findings and the plan of care as documented in the student?s note. I have made changes to documentation as necessary. ? ?Patient presented in spontaneous labor, also noted to have elevated blood pressures. Mild range currently. No symptoms. Pre-E labs normal on admission. Will continue to monitor closely. Continues to painfully contract regularly, will continue expectant management for now. Plan for AROM and/or Pitocin for augmentation as needed. Cat 1 FHT. Will reassess in 4 hours.  ? ?Vilma Meckel, MD ?OB Fellow, Faculty Practice ?[place] [facility] ?06/04/2021 1:05 AM ? ?

## 2021-06-03 NOTE — MAU Provider Note (Signed)
Event Date/Time  ? First Provider Initiated Contact with Patient 06/03/21 1214   ?  ? ? ?S: [name] is a 21 y.o. G1P0000 at [redacted]w[redacted]d who presents to MAU today complaining of possible leaking of fluid since first thing this morning. She denies vaginal bleeding. She endorses contractions. She reports normal fetal movement.   ? ?O: BP 131/79 (BP Location: Right Arm)   Pulse 89   Temp 98.6 ?F (37 ?C) (Oral)   Resp 18   Ht '5\' 7"'$  (1.702 m)   Wt 73.6 kg   LMP 09/06/2020   SpO2 100%   BMI 25.40 kg/m?  ?GENERAL: Well-developed, well-nourished female in no acute distress.  ?HEAD: Normocephalic, atraumatic.  ?CHEST: Normal effort of breathing, regular heart rate ?ABDOMEN: Soft, nontender, gravid ?PELVIC: Normal external female genitalia. Vagina is pink and rugated. Cervix with normal contour, no lesions. Normal discharge.  Negative pooling.  ? ?Cervical exam:  ?Dilation: 3 ?Effacement (%): 80 ?Cervical Position: Posterior ?Station: -2 ?Presentation: Vertex ?Exam by:: FTruitt Leep RNC ? ? ?Fetal Monitoring: ?Baseline: 135 ?Variability: Mod ?Accelerations: 15 x 15 ?Decelerations: None ?Contractions: q 6-8 min ? ?Results for orders placed or performed during the hospital encounter of 06/03/21 (from the past 24 hour(s))  ?POCT fern test     Status: None  ? Collection Time: 06/03/21 12:53 PM  ?Result Value Ref Range  ? POCT Fern Test Negative = intact amniotic membranes   ? ? ? ?A: ?SIUP at 359w4d?Membranes intact (negative pooling, negative fern) ? ?P: ?Remainder of labor evaluation managed by L&D team. See separate note ? ?SaMallie SnooksCNM ?06/03/2021 2:52 PM ? ?

## 2021-06-03 NOTE — MAU Note (Signed)
I was here earlier today and was 3cm. Ctxs stronger now. Some bloody show.  Feels FM but states movement is "slower". Denies LOF ?

## 2021-06-03 NOTE — MAU Provider Note (Signed)
Ms. Tashia Leiterman is a G1P0000 at 51w4dseen in MAU for labor.  ? ?RN labor check, not seen by provider.  ? ?SVE by RN Dilation: 3 ?Effacement (%): 80 ?Cervical Position: Posterior ?Station: -2 ?Presentation: Vertex ?Exam by:: F. Morris, RNC  ? ?NST - FHR: 135 bpm / moderate variability / accels present / decels absent ?NST reactive / TOCO: regular every 6-8 mins  ? ?Plan: ?D/C home with labor precautions ?Keep scheduled appt with [facility]  ? ?LFatima Blank CNM  ?06/03/2021 2:41 PM   ?

## 2021-06-03 NOTE — MAU Note (Signed)
Kendra Barajas is a 21 y.o. at 50w4dhere in MAU reporting: contractions since Sunday and states they have been getting closer. They are about every 5 minutes. Also her mucus plug came out. No bleeding. Unsure about LOF, states panties were wet. States no FM since 0200. ? ?Onset of complaint: ongoing ? ?Pain score: 8/10 ? ?Vitals:  ? 06/03/21 1132  ?BP: 127/72  ?Pulse: 85  ?Resp: 16  ?Temp: 98.8 ?F (37.1 ?C)  ?SpO2: 100%  ?   ?FHT:150 ? ?Lab orders placed from triage: none ? ?

## 2021-06-04 ENCOUNTER — Inpatient Hospital Stay (HOSPITAL_COMMUNITY): Payer: Medicaid Other | Admitting: Anesthesiology

## 2021-06-04 ENCOUNTER — Encounter (HOSPITAL_COMMUNITY): Payer: Self-pay | Admitting: Obstetrics & Gynecology

## 2021-06-04 DIAGNOSIS — Z3A38 38 weeks gestation of pregnancy: Secondary | ICD-10-CM

## 2021-06-04 DIAGNOSIS — O134 Gestational [pregnancy-induced] hypertension without significant proteinuria, complicating childbirth: Secondary | ICD-10-CM

## 2021-06-04 DIAGNOSIS — O139 Gestational [pregnancy-induced] hypertension without significant proteinuria, unspecified trimester: Secondary | ICD-10-CM

## 2021-06-04 HISTORY — DX: Gestational (pregnancy-induced) hypertension without significant proteinuria, unspecified trimester: O13.9

## 2021-06-04 LAB — RPR: RPR Ser Ql: NONREACTIVE

## 2021-06-04 LAB — TYPE AND SCREEN
ABO/RH(D): O POS
Antibody Screen: NEGATIVE

## 2021-06-04 LAB — CBC
HCT: 31.7 % — ABNORMAL LOW (ref 36.0–46.0)
Hemoglobin: 10.4 g/dL — ABNORMAL LOW (ref 12.0–15.0)
MCH: 30.6 pg (ref 26.0–34.0)
MCHC: 32.8 g/dL (ref 30.0–36.0)
MCV: 93.2 fL (ref 80.0–100.0)
Platelets: 115 10*3/uL — ABNORMAL LOW (ref 150–400)
RBC: 3.4 MIL/uL — ABNORMAL LOW (ref 3.87–5.11)
RDW: 13.5 % (ref 11.5–15.5)
WBC: 11.8 10*3/uL — ABNORMAL HIGH (ref 4.0–10.5)
nRBC: 0 % (ref 0.0–0.2)

## 2021-06-04 LAB — RESP PANEL BY RT-PCR (FLU A&B, COVID) ARPGX2
Influenza A by PCR: NEGATIVE
Influenza B by PCR: NEGATIVE
SARS Coronavirus 2 by RT PCR: NEGATIVE

## 2021-06-04 MED ORDER — NIFEDIPINE ER OSMOTIC RELEASE 30 MG PO TB24: 30.0000 mg | ORAL_TABLET | Freq: Two times a day (BID) | ORAL | Status: DC

## 2021-06-04 MED ORDER — METHYLERGONOVINE MALEATE 0.2 MG PO TABS: 0.2000 mg | ORAL_TABLET | ORAL | Status: DC | PRN

## 2021-06-04 MED ORDER — FLEET ENEMA 7-19 GM/118ML RE ENEM: 1.0000 | ENEMA | Freq: Every day | RECTAL | Status: DC | PRN

## 2021-06-04 MED ORDER — LACTATED RINGERS IV SOLN: 500.0000 mL | Freq: Once | INTRAVENOUS | Status: DC

## 2021-06-04 MED ORDER — FERROUS SULFATE 325 (65 FE) MG PO TABS
325.0000 mg | ORAL_TABLET | ORAL | Status: DC
  Administered 2021-06-04 – 2021-06-06 (×2): 325 mg via ORAL
  Filled 2021-06-04 (×2): qty 1

## 2021-06-04 MED ORDER — MEASLES, MUMPS & RUBELLA VAC IJ SOLR: 0.5000 mL | Freq: Once | INTRAMUSCULAR | Status: DC

## 2021-06-04 MED ORDER — TERBUTALINE SULFATE 1 MG/ML IJ SOLN: 0.2500 mg | Freq: Once | INTRAMUSCULAR | Status: DC | PRN

## 2021-06-04 MED ORDER — TETANUS-DIPHTH-ACELL PERTUSSIS 5-2.5-18.5 LF-MCG/0.5 IM SUSY: 0.5000 mL | PREFILLED_SYRINGE | Freq: Once | INTRAMUSCULAR | Status: DC

## 2021-06-04 MED ORDER — ACETAMINOPHEN 325 MG PO TABS: 650.0000 mg | ORAL_TABLET | ORAL | Status: DC | PRN

## 2021-06-04 MED ORDER — IBUPROFEN 600 MG PO TABS
600.0000 mg | ORAL_TABLET | Freq: Four times a day (QID) | ORAL | Status: DC
  Administered 2021-06-04 – 2021-06-06 (×8): 600 mg via ORAL
  Filled 2021-06-04 (×7): qty 1

## 2021-06-04 MED ORDER — LIDOCAINE HCL (PF) 1 % IJ SOLN
INTRAMUSCULAR | Status: DC | PRN
  Administered 2021-06-04 (×2): 4 mL via EPIDURAL

## 2021-06-04 MED ORDER — NIFEDIPINE ER OSMOTIC RELEASE 30 MG PO TB24
30.0000 mg | ORAL_TABLET | Freq: Every day | ORAL | Status: DC
  Administered 2021-06-04 – 2021-06-06 (×2): 30 mg via ORAL
  Filled 2021-06-04 (×2): qty 1

## 2021-06-04 MED ORDER — DIBUCAINE (PERIANAL) 1 % EX OINT
1.0000 | TOPICAL_OINTMENT | CUTANEOUS | Status: DC | PRN
Start: 2021-06-04 — End: 2021-06-06

## 2021-06-04 MED ORDER — FUROSEMIDE 20 MG PO TABS
20.0000 mg | ORAL_TABLET | Freq: Every day | ORAL | Status: DC
  Administered 2021-06-05 – 2021-06-06 (×2): 20 mg via ORAL
  Filled 2021-06-04 (×2): qty 1

## 2021-06-04 MED ORDER — ONDANSETRON HCL 4 MG PO TABS: 4.0000 mg | ORAL_TABLET | ORAL | Status: DC | PRN

## 2021-06-04 MED ORDER — FENTANYL-BUPIVACAINE-NACL 0.5-0.125-0.9 MG/250ML-% EP SOLN
12.0000 mL/h | EPIDURAL | Status: DC | PRN
  Administered 2021-06-04: 03:00:00 12 mL/h via EPIDURAL
  Filled 2021-06-04: qty 250

## 2021-06-04 MED ORDER — DIPHENHYDRAMINE HCL 50 MG/ML IJ SOLN: 12.5000 mg | INTRAMUSCULAR | Status: DC | PRN

## 2021-06-04 MED ORDER — PHENYLEPHRINE 40 MCG/ML (10ML) SYRINGE FOR IV PUSH (FOR BLOOD PRESSURE SUPPORT): 80.0000 ug | PREFILLED_SYRINGE | INTRAVENOUS | Status: DC | PRN

## 2021-06-04 MED ORDER — EPHEDRINE 5 MG/ML INJ: 10.0000 mg | INTRAVENOUS | Status: DC | PRN

## 2021-06-04 MED ORDER — BENZOCAINE-MENTHOL 20-0.5 % EX AERO
1.0000 "application " | INHALATION_SPRAY | CUTANEOUS | Status: DC | PRN
  Filled 2021-06-04: qty 56

## 2021-06-04 MED ORDER — PRENATAL MULTIVITAMIN CH
1.0000 | ORAL_TABLET | Freq: Every day | ORAL | Status: DC
  Administered 2021-06-04 – 2021-06-06 (×3): 1 via ORAL
  Filled 2021-06-04 (×3): qty 1

## 2021-06-04 MED ORDER — BISACODYL 10 MG RE SUPP: 10.0000 mg | Freq: Every day | RECTAL | Status: DC | PRN

## 2021-06-04 MED ORDER — MEDROXYPROGESTERONE ACETATE 150 MG/ML IM SUSP
150.0000 mg | INTRAMUSCULAR | Status: DC | PRN
Start: 2021-06-04 — End: 2021-06-06

## 2021-06-04 MED ORDER — COCONUT OIL OIL: 1.0000 "application " | TOPICAL_OIL | Status: DC | PRN

## 2021-06-04 MED ORDER — DIPHENHYDRAMINE HCL 25 MG PO CAPS: 25.0000 mg | ORAL_CAPSULE | Freq: Four times a day (QID) | ORAL | Status: DC | PRN

## 2021-06-04 MED ORDER — OXYTOCIN-SODIUM CHLORIDE 30-0.9 UT/500ML-% IV SOLN
1.0000 m[IU]/min | INTRAVENOUS | Status: DC
  Administered 2021-06-04: 04:00:00 2 m[IU]/min via INTRAVENOUS

## 2021-06-04 MED ORDER — ONDANSETRON HCL 4 MG/2ML IJ SOLN: 4.0000 mg | INTRAMUSCULAR | Status: DC | PRN

## 2021-06-04 MED ORDER — SIMETHICONE 80 MG PO CHEW: 80.0000 mg | CHEWABLE_TABLET | ORAL | Status: DC | PRN

## 2021-06-04 MED ORDER — DOCUSATE SODIUM 100 MG PO CAPS
100.0000 mg | ORAL_CAPSULE | Freq: Two times a day (BID) | ORAL | Status: DC
  Administered 2021-06-04 – 2021-06-06 (×4): 100 mg via ORAL
  Filled 2021-06-04 (×4): qty 1

## 2021-06-04 MED ORDER — WITCH HAZEL-GLYCERIN EX PADS: 1.0000 "application " | MEDICATED_PAD | CUTANEOUS | Status: DC | PRN

## 2021-06-04 MED ORDER — METHYLERGONOVINE MALEATE 0.2 MG/ML IJ SOLN: 0.2000 mg | INTRAMUSCULAR | Status: DC | PRN

## 2021-06-04 NOTE — Progress Notes (Signed)
Patient Vitals for the past 4 hrs: ? BP Temp Temp src Pulse Resp  ?06/04/21 0904 124/77 98.7 ?F (37.1 ?C) Oral 91 18  ?06/04/21 0830 124/65 -- -- 65 --  ?06/04/21 0800 112/65 -- -- 71 --  ?06/04/21 0730 (!) 117/52 -- -- 81 17  ?06/04/21 0719 -- 98.4 ?F (36.9 ?C) Oral -- --  ?06/04/21 0700 123/71 -- -- 66 --  ?06/04/21 0630 128/75 -- -- 66 --  ?06/04/21 0600 125/72 -- -- 75 16  ?06/04/21 0530 134/81 -- -- 73 --  ? ?Comfortable w/ epidural.  FHR Cat 1.  Not tracing ctx, IUPC placed.  Cx 6/90/0.  Pitocin at 14 mu/min.  Will titrate prn for adequate labor.    ?

## 2021-06-04 NOTE — Lactation Note (Addendum)
This note was copied from a baby's chart. ?Lactation Consultation Note ? ?Patient Name: Kendra Barajas ?Today's Date: 06/04/2021 ?Reason for consult: Initial assessment;Mother's request;Difficult latch;Primapara;1st time breastfeeding;Early term 37-38.6wks;Breastfeeding assistance;Other (Comment) (PIH ( Nifedpine L2)) ?Age:21 hours ? ?LC assisted latching infant at the breast with signs of milk transfer. Mom denied any pain with the latch. Mom able to get colostrum with hand expression. Hand pump reviewed, pre pump 5-10 min before latching ? ?Plan 1. To feed based on cues 8-12x 24hr period. Mom to offer breasts and look for signs of milk transfer.  ?2. If unable to latch, Mom to offer eBM via spoon 5-7 ml per feeding.  ?3. I and O sheet reviewed.  ?All questions answered at the end of the visit.  ? ?Maternal Data ?Has patient been taught Hand Expression?: Yes ?Does the patient have breastfeeding experience prior to this delivery?: No ? ?Feeding ?Mother's Current Feeding Choice: Breast Milk ? ?LATCH Score ?Latch: Repeated attempts needed to sustain latch, nipple held in mouth throughout feeding, stimulation needed to elicit sucking reflex. ? ?Audible Swallowing: Spontaneous and intermittent ? ?Type of Nipple: Everted at rest and after stimulation ? ?Comfort (Breast/Nipple): Soft / non-tender ? ?Hold (Positioning): Assistance needed to correctly position infant at breast and maintain latch. ? ?LATCH Score: 8 ? ? ?Lactation Tools Discussed/Used ?  ? ?Interventions ?Interventions: Breast feeding basics reviewed;Assisted with latch;Skin to skin;Breast massage;Hand express;Breast compression;Adjust position;Support pillows;Position options;Expressed milk;Education;LC Magazine features editor;Infant Driven Feeding Algorithm education ? ?Discharge ?Pump: Manual ?WIC Program: Yes ? ?Consult Status ?Consult Status: Follow-up ?Date: 06/05/21 ?Follow-up type: In-patient ? ? ? ?Neeka Urista  Nicholson-Springer ?06/04/2021, 11:14 PM ? ? ? ?

## 2021-06-04 NOTE — Progress Notes (Signed)
Labor Progress Note ?Kendra Barajas is a 21 y.o. G1P0000 at 41w5dpresented for SOL with gHTN. ?S:  ?The patient is resting in bed and having regular painful contractions. She is in less pain following her epidural. She denies headache, dizziness, visual changes, and RUQ/epigastric pain. ? ?O:  ?BP 125/62   Pulse 64   Temp 98.3 ?F (36.8 ?C) (Oral)   Resp 16   Ht '5\' 7"'$  (1.702 m)   Wt 73.9 kg   LMP 09/06/2020   SpO2 99%   BMI 25.53 kg/m?  ?EFM: Baseline 135 bpm/Moderate variability/+accels/absent decels ?Contractions: Every 6-8 minutes with coupling ? ?CVE: Dilation: 5 ?Effacement (%): 90 ?Station: -2 ?Presentation: Vertex ?Exam by:: M Hopkins RN ? ? ?A&P: 21y.o. [id number] complicated by gHTN onset. ?#Labor: Regular painful contractions continuing but have become more spaced out. Augmenting with pitocin to increase frequency of contractions and will assess for possible AROM at next cervical check or in 4 hours. ?#Pain: Epidural administered ?#FWB: Category 1 ?#GBS negative ?#gHTN: Mild range hypertensive blood pressures continuing without symptoms or severe features. Continue monitoring closely. ? ?Jeanclaude Wentworth ShMining engineerMedical Student ?4:07 AM  ?

## 2021-06-04 NOTE — Lactation Note (Signed)
This note was copied from a baby's chart. ?Lactation Consultation Note ? ?Patient Name: Kendra Barajas ?Today's Date: 06/04/2021 ?Reason for consult: L&D Initial assessment;1st time breastfeeding;Primapara;Early term 37-38.6wks ?Age:21 hours ? ?LC in to assist with first feeding after birth.  Mom had baby sleeping STS on her chest.  Mom shared that she may just want to pump.  Reviewed hand expression, no colostrum noted at this time.   ? ?Baby showing subtle cues.  LC moved baby to cross cradle hold and baby opened his mouth and latched and suckled a few time.   ? ?Mom knows to call for assistance with latching.  If Mom chooses to pump exclusively to ask for a pump set up. Mom states she has 3 pumps at home.  Reminded Mom that baby is the best pump. ? ?Maternal Data ?Has patient been taught Hand Expression?: Yes ?Does the patient have breastfeeding experience prior to this delivery?: No ? ?Feeding ?Mother's Current Feeding Choice: Breast Milk and Formula ?Nipple Type:  (few sucks) ? ?LATCH Score ?Latch: Repeated attempts needed to sustain latch, nipple held in mouth throughout feeding, stimulation needed to elicit sucking reflex. ? ?Audible Swallowing: None ? ?Type of Nipple: Everted at rest and after stimulation ? ?Comfort (Breast/Nipple): Soft / non-tender ? ?Hold (Positioning): Assistance needed to correctly position infant at breast and maintain latch. ? ?LATCH Score: 6 ? ? ?Lactation Tools Discussed/Used ?  ? ?Interventions ?Interventions: Breast feeding basics reviewed;Assisted with latch;Skin to skin;Hand express;Adjust position;Position options ? ?Discharge ?  ? ?Consult Status ?Consult Status: Follow-up from L&D ?Date: 06/04/21 ?Follow-up type: In-patient ? ? ? ?Tilda Burrow E ?06/04/2021, 1:27 PM ? ? ? ?

## 2021-06-04 NOTE — Discharge Summary (Signed)
? ?  Postpartum Discharge Summary ? ?Date of Service updated ? ?   ?Patient Name: Kendra Barajas ?DOB: 11/13/2000 ?MRN: [id number] ? ?Date of admission: 06/03/2021 ?Delivery date:06/04/2021  ?Delivering provider: Christin Fudge  ?Date of discharge: 06/06/2021 ? ?Admitting diagnosis: Indication for care in labor or delivery [O75.9] ?Intrauterine pregnancy: [redacted]w[redacted]d     ?Secondary diagnosis:  Principal Problem: ?  Indication for care in labor or delivery ?Active Problems: ?  Gestational hypertension ? ?Additional problems: None    ?Discharge diagnosis: Term Pregnancy Delivered and Gestational Hypertension                                              ?Post partum procedures: depo  ?Augmentation: Pitocin ?Complications: None ? ?Hospital course: Onset of Labor With Vaginal Delivery      ?21 y.o. yo G1P0000 at [redacted]w[redacted]d was admitted in Latent Labor on 06/03/2021. Patient had an uncomplicated labor course as follows:  ?Membrane Rupture Time/Date: 7:30 AM ,06/04/2021   ?Delivery Method:Vaginal, Spontaneous  ?Episiotomy: None  ?Lacerations:  None  ?Patient had an uncomplicated postpartum course.  She is ambulating, tolerating a regular diet, passing flatus, and urinating well. Patient is discharged home in stable condition on 06/06/21. ? ?Newborn Data: ?Birth date:06/04/2021  ?Birth time:12:34 PM  ?Gender:Female  ?Living status:Living  ?Apgars:8 ,9  ?Weight:3360 g  ? ?Magnesium Sulfate received: No ?BMZ received: No ?Rhophylac:N/A ?MMR:N/A ?T-DaP:Given prenatally ?Flu: Yes ?Transfusion:No ? ?Physical exam  ?Vitals:  ? 06/05/21 0548 06/05/21 1446 06/05/21 1900 06/06/21 3875  ?BP: 116/75 121/82 126/81 128/82  ?Pulse: 74 78 61 68  ?Resp: $Remov'16 17 17 18  'LxBFpm$ ?Temp: 98.7 ?F (37.1 ?C) 99 ?F (37.2 ?C) 98.5 ?F (36.9 ?C) 98.4 ?F (36.9 ?C)  ?TempSrc: Oral Oral Oral Oral  ?SpO2:  100%  100%  ?Weight:      ?Height:      ? ?General: alert, cooperative, and no distress ?Lochia: appropriate ?Uterine Fundus: firm ?Incision: N/A ?DVT Evaluation: No  significant calf/ankle edema. ?Labs: ?Lab Results  ?Component Value Date  ? WBC 11.8 (H) 06/04/2021  ? HGB 10.4 (L) 06/04/2021  ? HCT 31.7 (L) 06/04/2021  ? MCV 93.2 06/04/2021  ? PLT 115 (L) 06/04/2021  ? ?CMP Latest Ref Rng & Units 06/03/2021  ?Glucose 70 - 99 mg/dL 86  ?BUN 6 - 20 mg/dL 5(L)  ?Creatinine 0.44 - 1.00 mg/dL 0.72  ?Sodium 135 - 145 mmol/L 134(L)  ?Potassium 3.5 - 5.1 mmol/L 3.3(L)  ?Chloride 98 - 111 mmol/L 105  ?CO2 22 - 32 mmol/L 20(L)  ?Calcium 8.9 - 10.3 mg/dL 8.8(L)  ?Total Protein 6.5 - 8.1 g/dL 6.0(L)  ?Total Bilirubin 0.3 - 1.2 mg/dL 0.4  ?Alkaline Phos 38 - 126 U/L 107  ?AST 15 - 41 U/L 30  ?ALT 0 - 44 U/L 14  ? ?Edinburgh Score: ?Edinburgh Postnatal Depression Scale Screening Tool 06/04/2021  ?I have been able to laugh and see the funny side of things. 0  ?I have looked forward with enjoyment to things. 0  ?I have blamed myself unnecessarily when things went wrong. 2  ?I have been anxious or worried for no good reason. 0  ?I have felt scared or panicky for no good reason. 0  ?Things have been getting on top of me. 0  ?I have been so unhappy that I have had difficulty sleeping. 0  ?I have felt  sad or miserable. 1  ?I have been so unhappy that I have been crying. 1  ?The thought of harming myself has occurred to me. 0  ?Edinburgh Postnatal Depression Scale Total 4  ? ? ? ?After visit meds:  ?Allergies as of 06/06/2021   ?No Known Allergies ?  ? ?  ?Medication List  ?  ? ?STOP taking these medications   ? ?terconazole 0.4 % vaginal cream ?Commonly known as: TERAZOL 7 ?  ? ?  ? ?TAKE these medications   ? ?acetaminophen 500 MG tablet ?Commonly known as: TYLENOL ?Take 1 tablet (500 mg total) by mouth every 6 (six) hours as needed. ?What changed: Another medication with the same name was added. Make sure you understand how and when to take each. ?  ?acetaminophen 325 MG tablet ?Commonly known as: Tylenol ?Take 2 tablets (650 mg total) by mouth every 4 (four) hours as needed (for pain scale <  4). ?What changed: You were already taking a medication with the same name, and this prescription was added. Make sure you understand how and when to take each. ?  ?albuterol 108 (90 Base) MCG/ACT inhaler ?Commonly known as: VENTOLIN HFA ?Inhale 2 puffs into the lungs every 4 (four) hours as needed. ?  ?Blood Pressure Kit Devi ?1 Device by Does not apply route as needed. ?  ?docusate sodium 100 MG capsule ?Commonly known as: COLACE ?Take 1 capsule (100 mg total) by mouth 2 (two) times daily as needed. ?  ?famotidine 20 MG tablet ?Commonly known as: PEPCID ?Take 1 tablet (20 mg total) by mouth 2 (two) times daily. ?  ?ferrous sulfate 325 (65 FE) MG tablet ?Take 1 tablet (325 mg total) by mouth every other day. ?  ?fluticasone-salmeterol 100-50 MCG/ACT Aepb ?Commonly known as: ADVAIR ?Inhale 1 puff into the lungs 2 (two) times daily. ?  ?furosemide 20 MG tablet ?Commonly known as: LASIX ?Take 1 tablet (20 mg total) by mouth daily. ?  ?Gojji Weight Scale Misc ?1 Device by Does not apply route as needed. ?  ?ibuprofen 600 MG tablet ?Commonly known as: ADVIL ?Take 1 tablet (600 mg total) by mouth every 6 (six) hours. ?  ?NIFEdipine 30 MG 24 hr tablet ?Commonly known as: ADALAT CC ?Take 1 tablet (30 mg total) by mouth daily. ?  ?polyethylene glycol powder 17 GM/SCOOP powder ?Commonly known as: GLYCOLAX/MIRALAX ?Take 17 g by mouth daily. ?  ?PrePLUS 27-1 MG Tabs ?Take 1 tablet by mouth daily. ?  ? ?  ? ? ? ?Discharge home in stable condition ?Infant Feeding: Bottle and Breast ?Infant Disposition:home with mother ?Discharge instruction: per After Visit Summary and Postpartum booklet. ?Activity: Advance as tolerated. Pelvic rest for 6 weeks.  ?Diet: routine diet ?Future Appointments: ?Future Appointments  ?Date Time Provider [place]  ?06/08/2021  9:50 AM [facility]  ?07/08/2021  1:55 PM Clarnce Flock, MD [facility]  ? ?Follow up Visit: ? ? ?Please schedule this patient for a Virtual postpartum  visit in 4 weeks with the following provider: Any provider. ?Additional Postpartum F/U:BP check 1 week  ?Low risk pregnancy complicated by: HTN ?Delivery mode:  Vaginal, Spontaneous  ?Anticipated Birth Control:  Depo  ? ? ?06/06/2021 ?Patriciaann Clan, DO ? ? ? ?

## 2021-06-04 NOTE — Anesthesia Procedure Notes (Signed)
Epidural ?Patient location during procedure: OB ?Start time: 06/04/2021 2:24 AM ?End time: 06/04/2021 2:27 AM ? ?Staffing ?Anesthesiologist: Audry Pili, MD ?Performed: anesthesiologist  ? ?Preanesthetic Checklist ?Completed: patient identified, IV checked, risks and benefits discussed, monitors and equipment checked, pre-op evaluation and timeout performed ? ?Epidural ?Patient position: sitting ?Prep: DuraPrep ?Patient monitoring: continuous pulse ox and blood pressure ?Approach: midline ?Location: L2-L3 ?Injection technique: LOR saline ? ?Needle:  ?Needle type: Tuohy  ?Needle gauge: 17 G ?Needle length: 9 cm ?Needle insertion depth: 4 cm ?Catheter size: 19 Gauge ?Catheter at skin depth: 9 cm ?Test dose: negative and Other (1% lidocaine) ? ?Assessment ?Events: blood not aspirated ? ?Additional Notes ?Patient identified. Risks including, but not limited to, bleeding, infection, nerve damage, paralysis, inadequate analgesia, blood pressure changes, nausea, vomiting, allergic reaction, postpartum back pain, itching, and headache were discussed. Patient expressed understanding and wished to proceed. Sterile prep and drape, including hand hygiene, mask, and sterile gloves were used. The patient was positioned and the spine was prepped. The skin was anesthetized with lidocaine. No paraesthesia or other complication noted. The patient did not experience any signs of intravascular injection such as tinnitus or metallic taste in mouth, nor signs of intrathecal spread such as rapid motor block. Please see nursing notes for vital signs. The patient tolerated the procedure well.  ? ?Renold Don, MDReason for block:procedure for pain ? ? ? ?

## 2021-06-04 NOTE — Anesthesia Preprocedure Evaluation (Signed)
Anesthesia Evaluation  ?Patient identified by MRN, date of birth, ID band ?Patient awake ? ? ? ?Reviewed: ?Allergy & Precautions, NPO status , Patient's Chart, lab work & pertinent test results ? ?History of Anesthesia Complications ?Negative for: history of anesthetic complications ? ?Airway ?Mallampati: II ? ? ?Neck ROM: Full ? ? ? Dental ?  ?Pulmonary ?asthma ,  ?  ?Pulmonary exam normal ? ? ? ? ? ? ? Cardiovascular ?negative cardio ROS ?Normal cardiovascular exam ? ? ?  ?Neuro/Psych ?PSYCHIATRIC DISORDERS Anxiety Depression negative neurological ROS ?   ? GI/Hepatic ?negative GI ROS, Neg liver ROS,   ?Endo/Other  ?negative endocrine ROS ? Renal/GU ?negative Renal ROS  ? ?  ?Musculoskeletal ?negative musculoskeletal ROS ?(+)  ? Abdominal ?  ?Peds ? Hematology ? ?(+) Blood dyscrasia, anemia ,  ?Plt 121k ?   ?Anesthesia Other Findings ? ? Reproductive/Obstetrics ?(+) Pregnancy ? ?  ? ? ? ? ? ? ? ? ? ? ? ? ? ?  ?  ? ? ? ? ? ? ? ? ?Anesthesia Physical ?Anesthesia Plan ? ?ASA: 2 ? ?Anesthesia Plan: Epidural  ? ?Post-op Pain Management:   ? ?Induction:  ? ?PONV Risk Score and Plan: 2 and Treatment may vary due to age or medical condition ? ?Airway Management Planned: Natural Airway ? ?Additional Equipment: None ? ?Intra-op Plan:  ? ?Post-operative Plan:  ? ?Informed Consent: I have reviewed the patients History and Physical, chart, labs and discussed the procedure including the risks, benefits and alternatives for the proposed anesthesia with the patient or authorized representative who has indicated his/her understanding and acceptance.  ? ? ? ? ? ?Plan Discussed with: Anesthesiologist ? ?Anesthesia Plan Comments: (Labs reviewed. Platelets acceptable, patient not taking any blood thinning medications. Per RN, FHR tracing reported to be stable enough for sitting procedure. Risks and benefits discussed with patient, including PDPH, backache, epidural hematoma, failed epidural, blood  pressure changes, allergic reaction, and nerve injury. Patient expressed understanding and wished to proceed.)  ? ? ? ? ? ? ?Anesthesia Quick Evaluation ? ?

## 2021-06-05 ENCOUNTER — Other Ambulatory Visit (HOSPITAL_COMMUNITY): Payer: Self-pay

## 2021-06-05 MED ORDER — IBUPROFEN 600 MG PO TABS
600.0000 mg | ORAL_TABLET | Freq: Four times a day (QID) | ORAL | 0 refills | Status: DC
  Filled 2021-06-05: qty 30, 8d supply, fill #0

## 2021-06-05 MED ORDER — NIFEDIPINE ER 30 MG PO TB24
30.0000 mg | ORAL_TABLET | Freq: Every day | ORAL | 0 refills | Status: DC
  Filled 2021-06-05: qty 90, 90d supply, fill #0

## 2021-06-05 MED ORDER — FUROSEMIDE 20 MG PO TABS
20.0000 mg | ORAL_TABLET | Freq: Every day | ORAL | 0 refills | Status: DC
  Filled 2021-06-05: qty 5, 5d supply, fill #0

## 2021-06-05 NOTE — Anesthesia Postprocedure Evaluation (Signed)
Anesthesia Post Note ? ?Patient: Kendra Barajas ? ?Procedure(s) Performed: AN AD HOC LABOR EPIDURAL ? ?  ? ?Patient location during evaluation: Mother Baby ?Anesthesia Type: Epidural ?Level of consciousness: awake and alert ?Pain management: pain level controlled ?Vital Signs Assessment: post-procedure vital signs reviewed and stable ?Respiratory status: spontaneous breathing, nonlabored ventilation and respiratory function stable ?Cardiovascular status: stable ?Postop Assessment: no headache, no backache, epidural receding, no apparent nausea or vomiting, patient able to bend at knees, adequate PO intake and able to ambulate ?Anesthetic complications: no ? ? ?No notable events documented. ? ?Last Vitals:  ?Vitals:  ? 06/04/21 2329 06/05/21 0548  ?BP: 118/68 116/75  ?Pulse: 76 74  ?Resp: 16 16  ?Temp: 37 ?C 37.1 ?C  ?SpO2:    ?  ?Last Pain:  ?Vitals:  ? 06/05/21 0548  ?TempSrc: Oral  ?PainSc: 5   ? ?Pain Goal: Patients Stated Pain Goal: 0 (06/03/21 2135) ? ?  ?  ?  ?  ?  ?  ?  ? ?Vickye Astorino Hristova ? ? ? ? ?

## 2021-06-05 NOTE — Progress Notes (Signed)
Post Partum Day 1 ?Subjective: ?no complaints, up ad lib, voiding and tolerating PO, small lochia, plans to breastfeed, Depo-Provera ? ?Objective: ?Blood pressure 116/75, pulse 74, temperature 98.7 ?F (37.1 ?C), temperature source Oral, resp. rate 16, height '5\' 7"'$  (1.702 m), weight 73.9 kg, last menstrual period 09/06/2020, SpO2 99 %, unknown if currently breastfeeding. ? ?Physical Exam:  ?General: alert, cooperative and no distress ?Lochia:normal flow ?Chest: CTAB ?Heart: RRR no m/r/g ?Abdomen: +BS, soft, nontender,  ?Uterine Fundus: firm ?DVT Evaluation: No evidence of DVT seen on physical exam. ?Extremities: no edema ? ?Recent Labs  ?  06/03/21 ?2243 06/04/21 ?1312  ?HGB 10.2* 10.4*  ?HCT 30.9* 31.7*  ? ? ?Assessment/Plan: ?Plan for discharge tomorrow and Circumcision prior to discharge ? ? LOS: 2 days  ? ?Kendra Barajas ?06/05/2021, 8:12 AM  ? ?

## 2021-06-05 NOTE — Lactation Note (Signed)
This note was copied from a baby's chart. ?Lactation Consultation Note ? ?Patient Name: Kendra Barajas ?Today's Date: 06/05/2021 ?Reason for consult: Follow-up assessment;Primapara;1st time breastfeeding;Early term 37-38.6wks;Infant weight loss;Breastfeeding assistance;Nipple pain/trauma;Other (Comment);Mother's request ?Age:20 hours ?LC instructed mom how the release the suction and recommended not pulling baby off the breast . LC noted a tiny intact blister on the nipple/ areola compressible.  ?Baby relatched with better support and depth. Per mom comfortable and swallows noted. Latch score 8 / baby still feeding .  ?Mom\s feeding preference is breast formula .  ?LC encouraged mom - if baby is still hungry after the 1st breast offer the 2nd breast. If satisfied hold off on the formula if not keep it below 30 ml.  ?Kendra Barajas praised mom for her efforts.  ?Latch score 8  ? ?Maternal Data ?  ? ?Feeding ?Mother's Current Feeding Choice: Breast Milk ? ?LATCH Score ?Latch: Grasps breast easily, tongue down, lips flanged, rhythmical sucking. ? ?Audible Swallowing: A few with stimulation ? ?Type of Nipple: Everted at rest and after stimulation ? ?Comfort (Breast/Nipple): Soft / non-tender ? ?Hold (Positioning): Assistance needed to correctly position infant at breast and maintain latch. ? ?LATCH Score: 8 ? ? ?Lactation Tools Discussed/Used ?Tools:  (hand pump was already in the room at bedside) ?Breast pump type: Manual ? ?Interventions ?Interventions: Breast feeding basics reviewed;Assisted with latch;Skin to skin;Hand express;Adjust position;Support pillows;Position options;Breast compression;Education ? ?Discharge ?Pump: Manual ? ?Consult Status ?Consult Status: Follow-up ?Date: 06/06/21 ?Follow-up type: In-patient ? ? ? ?Kendra Barajas ?06/05/2021, 5:53 PM ? ? ? ?

## 2021-06-05 NOTE — Progress Notes (Signed)
Patient's blood pressures since 2015 yesterday have been 118/66, 118/68, 116/75. Patient was ordered Procardia-XL 30 mg PO and Lasix 20 mg PO this AM. Notified Dr. Nelda Marseille. Orders to hold the Procardia-XL this AM but still give the Lasix 20 mg PO. Will continue to monitor blood pressures. ?

## 2021-06-06 MED ORDER — FERROUS SULFATE 325 (65 FE) MG PO TABS
325.0000 mg | ORAL_TABLET | ORAL | 0 refills | Status: DC
Start: 2021-06-06 — End: 2021-07-08

## 2021-06-06 MED ORDER — ACETAMINOPHEN 325 MG PO TABS: 650.0000 mg | ORAL_TABLET | ORAL | Status: DC | PRN

## 2021-06-06 NOTE — Lactation Note (Addendum)
This note was copied from a baby's chart. ?Lactation Consultation Note ? ?Patient Name: Kendra Barajas ?Today's Date: 06/06/2021 ?Reason for consult: Early term 37-38.6wks;Primapara ?Age:21 hours ? ?Infant was at breast when I entered room, but it appeared to be a sleepy feeding. Mom endorsed that infant was more vigorous at previous feedings and she can hear swallows. Formula was in the room, but infant has not received any formula. Infant has had great output: 8 voids & 7 stools since birth. ? ?After discussion with Mom, she no longer thinks she will pump & BO only. She now wants to continue putting infant to breast & will pump prn.  ?  ?Mom reports increased breast heaviness today. Mom knows to look for softening of breast as infant feeds.   ? ?Feeding frequency for newborn discussed. Mom was made aware of [facility] & knows how to get in touch with [place] for any post-discharge questions.  ? ?Mom's questions were answered to her satisfaction.  ? ?Kendra Barajas ?06/06/2021, 12:16 PM ? ? ? ?

## 2021-06-08 ENCOUNTER — Ambulatory Visit (INDEPENDENT_AMBULATORY_CARE_PROVIDER_SITE_OTHER): Payer: Medicaid Other | Admitting: Family Medicine

## 2021-06-08 ENCOUNTER — Encounter: Payer: Self-pay | Admitting: Family Medicine

## 2021-06-08 ENCOUNTER — Other Ambulatory Visit: Payer: Self-pay

## 2021-06-08 VITALS — BP 133/85 | HR 90 | Wt 146.2 lb

## 2021-06-08 DIAGNOSIS — O139 Gestational [pregnancy-induced] hypertension without significant proteinuria, unspecified trimester: Secondary | ICD-10-CM

## 2021-06-08 NOTE — Progress Notes (Signed)
? ? ?  POSTPARTUM PROBLEM  VISIT ENCOUNTER NOTE ? ?Subjective:  ? Kendra Barajas is a 21 y.o. G82P1001 female here for a problem visit.  Pregnancy was complicated by gestational HTN. ? ?Patient had Vaginal, no problems at delivery about 3 days ago. ? ?Since delivery patient reports she has been feeling good. ? ?Infant feeding: ?Patient is providing breastmilk.  ? ?Current complaints: none, has one HA last night that went away with medications.   Denies abnormal vaginal bleeding, discharge, pelvic pain, problems with intercourse or other gynecologic concerns.  ?  ?Gynecologic History ?No LMP recorded. ? ?Contraception: Depo-Provera injections-- not started  ? ?Health Maintenance Due  ?Topic Date Due  ? PAP-Cervical Cytology Screening  05/21/2021  ? PAP SMEAR-Modifier  05/21/2021  ? ? ?The following portions of the patient's history were reviewed and updated as appropriate: allergies, current medications, past family history, past medical history, past social history, past surgical history and problem list. ? ?Review of Systems ?Pertinent items are noted in HPI. ?  ?Objective:  ?BP 133/85   Pulse 90   Wt 146 lb 3.2 oz (66.3 kg)   BMI 22.90 kg/m?  ?Gen: well appearing, NAD ?HEENT: no scleral icterus ?CV: RR ?Lung: Normal WOB ?Ext: warm well perfused ? ?Breast: normal-- has left nipple erythema, not open or tender to touch. ?GU: not indicated ? ? ?Assessment and Plan:  ?1. Gestational hypertension, antepartum ?BP is mild range today ?Continue medications ?Repeat BP in 1 week ? ?2. Supervision of normal first pregnancy, antepartum ?Delivered  ? ?Please refer to After Visit Summary for other counseling recommendations.  ? ?Return in about 1 week (around 06/15/2021) for BP check. ?Future Appointments  ?Date Time Provider [place]  ?06/15/2021 10:30 AM [facility]  ?07/08/2021  1:55 PM Clarnce Flock, MD [facility]  ?  ? ?Caren Macadam, MD, MPH, ABFM ?Attending Physician ?[place] for [place] ? ?

## 2021-06-08 NOTE — Progress Notes (Signed)
Pt here today for PP BP check.  Had headache last night but went away without Tylenol. Is taking BP med as prescribed.  ?

## 2021-06-12 ENCOUNTER — Encounter: Payer: Self-pay | Admitting: *Deleted

## 2021-06-15 ENCOUNTER — Other Ambulatory Visit: Payer: Self-pay

## 2021-06-15 ENCOUNTER — Encounter: Payer: Self-pay | Admitting: Family Medicine

## 2021-06-15 ENCOUNTER — Ambulatory Visit (INDEPENDENT_AMBULATORY_CARE_PROVIDER_SITE_OTHER): Payer: Medicaid Other | Admitting: Family Medicine

## 2021-06-15 VITALS — BP 129/86 | HR 85 | Wt 143.0 lb

## 2021-06-15 DIAGNOSIS — O133 Gestational [pregnancy-induced] hypertension without significant proteinuria, third trimester: Secondary | ICD-10-CM | POA: Diagnosis not present

## 2021-06-15 DIAGNOSIS — Z0130 Encounter for examination of blood pressure without abnormal findings: Secondary | ICD-10-CM

## 2021-06-15 NOTE — Progress Notes (Signed)
? ? ?  POSTPARTUM PROBLEM  VISIT ENCOUNTER NOTE ? ?Subjective:  ? Kendra Barajas is a 21 y.o. G82P1001 female here for a problem visit.  Pregnancy was complicated by gestational HTN. ? ?Patient had Vaginal, no problems at delivery about 2 weeks ago. ? ?Since delivery patient reports she has been feeling good. ? ?Infant feeding: ?Patient is providing breastmilk.  ? ?Current complaints: none.   Denies abnormal vaginal bleeding, discharge, pelvic pain, problems with intercourse or other gynecologic concerns.  ?  ?Gynecologic History ?No LMP recorded. ? ?Contraception: Depo-Provera injections ? ?Health Maintenance Due  ?Topic Date Due  ? PAP-Cervical Cytology Screening  05/21/2021  ? PAP SMEAR-Modifier  05/21/2021  ? ? ?The following portions of the patient's history were reviewed and updated as appropriate: allergies, current medications, past family history, past medical history, past social history, past surgical history and problem list. ? ?Review of Systems ?Pertinent items are noted in HPI. ?  ?Objective:  ?BP 129/86   Pulse 85   Wt 143 lb (64.9 kg)   BMI 22.40 kg/m?  ?Gen: well appearing, NAD ?HEENT: no scleral icterus ?CV: RR ?Lung: Normal WOB ?Ext: warm well perfused ? ?Breast: normal ?GU: not indicated ? ? ?Assessment and Plan:  ? ?#BP check ?- WNL today ? ?Please refer to After Visit Summary for other counseling recommendations.  ? ?Return in about 4 weeks (around 07/13/2021) for Mom+Baby Combined Care, Postpartum visit. ? ?Future Appointments  ?Date Time Provider [place]  ?07/08/2021  1:55 PM Clarnce Flock, MD [facility]  ? ? ?Caren Macadam, MD, MPH, ABFM ?Attending Physician ?[place] for [place] ? ?

## 2021-07-08 ENCOUNTER — Other Ambulatory Visit (HOSPITAL_COMMUNITY)
Admission: RE | Admit: 2021-07-08 | Discharge: 2021-07-08 | Disposition: A | Discharge status: Home / Self Care | Payer: Medicaid Other | Source: Ambulatory Visit | Attending: Family Medicine | Admitting: Family Medicine

## 2021-07-08 ENCOUNTER — Ambulatory Visit (INDEPENDENT_AMBULATORY_CARE_PROVIDER_SITE_OTHER): Payer: Medicaid Other | Admitting: Family Medicine

## 2021-07-08 DIAGNOSIS — R87610 Atypical squamous cells of undetermined significance on cytologic smear of cervix (ASC-US): Secondary | ICD-10-CM | POA: Insufficient documentation

## 2021-07-08 DIAGNOSIS — O133 Gestational [pregnancy-induced] hypertension without significant proteinuria, third trimester: Secondary | ICD-10-CM | POA: Diagnosis not present

## 2021-07-08 DIAGNOSIS — Z30013 Encounter for initial prescription of injectable contraceptive: Secondary | ICD-10-CM

## 2021-07-08 DIAGNOSIS — Z124 Encounter for screening for malignant neoplasm of cervix: Secondary | ICD-10-CM

## 2021-07-08 LAB — POCT URINALYSIS DIP (DEVICE)
Glucose, UA: NEGATIVE mg/dL
Leukocytes,Ua: NEGATIVE
Nitrite: NEGATIVE
Protein, ur: 30 mg/dL — AB
Specific Gravity, Urine: >=1.03 (ref 1.005–1.030)
Urobilinogen, UA: 0.2 mg/dL (ref 0.0–1.0)
pH: 6 (ref 5.0–8.0)

## 2021-07-08 MED ORDER — MEDROXYPROGESTERONE ACETATE 150 MG/ML IM SUSP
150.0000 mg | Freq: Once | INTRAMUSCULAR | Status: AC
  Administered 2021-07-08: 150 mg via INTRAMUSCULAR

## 2021-07-08 NOTE — Progress Notes (Signed)
? ? ?Post Partum Visit Note ? ?Kendra Barajas is a 21 y.o. G15P1001 female who presents for a postpartum visit. She is 5 weeks postpartum following a normal spontaneous vaginal delivery.  I have fully reviewed the prenatal and intrapartum course. The delivery was at 42w5dgestational weeks.  Anesthesia: epidural. Postpartum course has been uneventful. Baby is doing well. Baby is feeding by breast. Bleeding staining only. Bowel function is normal. Bladder function has urge to urinate but when she tries to use the bathroom is unable to do so. Patient is not sexually active. Contraception method is none. Postpartum depression screening: negative. ? ?Feels like urination is not as easy as before. Also feels like she's not urinating sa much as usual.  ? ?The pregnancy intention screening data noted above was reviewed. Potential methods of contraception were discussed. The patient elected to proceed with No data recorded. ? ? Edinburgh Postnatal Depression Scale - 07/08/21 1401   ? ?  ? Edinburgh Postnatal Depression Scale:  In the Past 7 Days  ? I have been able to laugh and see the funny side of things. 0   ? I have looked forward with enjoyment to things. 0   ? I have blamed myself unnecessarily when things went wrong. 0   ? I have been anxious or worried for no good reason. 0   ? I have felt scared or panicky for no good reason. 0   ? Things have been getting on top of me. 0   ? I have been so unhappy that I have had difficulty sleeping. 0   ? I have felt sad or miserable. 0   ? I have been so unhappy that I have been crying. 0   ? The thought of harming myself has occurred to me. 0   ? Edinburgh Postnatal Depression Scale Total 0   ? ?  ?  ? ?  ? ? ?Health Maintenance Due  ?Topic Date Due  ? PAP-Cervical Cytology Screening  05/21/2021  ? PAP SMEAR-Modifier  05/21/2021  ? ? ?The following portions of the patient's history were reviewed and updated as appropriate: allergies, current medications, past family history,  past medical history, past social history, past surgical history, and problem list. ? ?Review of Systems ?Pertinent items noted in HPI and remainder of comprehensive ROS otherwise negative. ? ?Objective:  ?BP (!) 131/93   Pulse 86   Wt 137 lb 3.2 oz (62.2 kg)   BMI 21.49 kg/m?   ? ?General:  alert, cooperative, and appears stated age  ? Breasts:  not indicated  ?Lungs: Comfortable on room air  ?GU exam:  normal  ?     ?Assessment:  ? ? There are no diagnoses linked to this encounter. ? ?Normal postpartum exam.  ? ?Plan:  ? ?Essential components of care per ACOG recommendations: ? ?1.  Mood and well being: Patient with negative depression screening today. Reviewed local resources for support.  ?- Patient tobacco use? No.   ?- hx of drug use? No.   ? ?2. Infant care and feeding:  ?-Patient currently breastmilk feeding? Yes. Reviewed importance of draining breast regularly to support lactation.  ?-Social determinants of health (SDOH) reviewed in EPIC. No concerns ? ?3. Sexuality, contraception and birth spacing ?- Patient does not want a pregnancy in the next year.  Desired family size is unsure number of children.  ?- Reviewed reproductive life planning. Reviewed contraceptive methods based on pt preferences and effectiveness.  Patient desired Hormonal Injection  today.  Depo given, will be scheduled for follow up ?- Discussed birth spacing of 18 months ? ?4. Sleep and fatigue ?-Encouraged family/partner/community support of 4 hrs of uninterrupted sleep to help with mood and fatigue ? ?5. Physical Recovery  ?- Discussed patients delivery and complications. She describes her labor as good. ?- Patient had a Vaginal, no problems at delivery. Patient had no laceration. Perineal healing reviewed. Patient expressed understanding ?- Patient has urinary incontinence? No. ?- Patient is safe to resume physical and sexual activity ? ?6.  Health Maintenance ?- HM due items addressed Yes ?- Last pap smear No results found for:  DIAGPAP Pap smear done at today's visit.  ?-Breast Cancer screening indicated? No.  ? ?7. Chronic Disease/Pregnancy Condition follow up: None ?-Bedside US after patient went to the bathroom was completely decompressed and hard to identify. Reassured patient no evidence of urinary retention, also not surprising that she needs more water since she is exclusively breastfeeding ?- PCP follow up ? ?Clarnce Flock, MD ?[facility] for [place], [place]  ?

## 2021-07-14 LAB — CYTOLOGY - PAP
Comment: NEGATIVE
Diagnosis: UNDETERMINED — AB
High risk HPV: POSITIVE — AB

## 2021-07-21 ENCOUNTER — Other Ambulatory Visit: Payer: Self-pay | Admitting: Family Medicine

## 2021-07-21 DIAGNOSIS — J302 Other seasonal allergic rhinitis: Secondary | ICD-10-CM

## 2021-07-21 MED ORDER — LORATADINE 10 MG PO TABS: 10.0000 mg | ORAL_TABLET | Freq: Every day | ORAL | 11 refills | Status: DC

## 2021-07-21 MED ORDER — FLUTICASONE PROPIONATE 50 MCG/ACT NA SUSP: 1.0000 | Freq: Every day | NASAL | 2 refills | Status: DC

## 2021-07-21 NOTE — Progress Notes (Signed)
Noted during visit for son Kendra Barajas that she has bad allergies, zyrtec not helping, rx sent for claritin and flonase ?

## 2021-08-05 ENCOUNTER — Encounter: Payer: Self-pay | Admitting: Family Medicine

## 2021-08-05 ENCOUNTER — Ambulatory Visit (INDEPENDENT_AMBULATORY_CARE_PROVIDER_SITE_OTHER): Payer: Medicaid Other | Admitting: Family Medicine

## 2021-08-05 VITALS — BP 155/89 | HR 91 | Wt 136.6 lb

## 2021-08-05 DIAGNOSIS — O133 Gestational [pregnancy-induced] hypertension without significant proteinuria, third trimester: Secondary | ICD-10-CM | POA: Diagnosis not present

## 2021-08-05 MED ORDER — NIFEDIPINE ER 60 MG PO TB24: 60.0000 mg | ORAL_TABLET | Freq: Every day | ORAL | 3 refills | Status: DC

## 2021-08-05 NOTE — Assessment & Plan Note (Signed)
Ongoing BP elevation, will increase nifedipine and rtc in 2 weeks for BP check. Discussed she may in fact have cHTN but we'll just have to wait and see. Encouraged to use tylenol/ibuprofen for headaches.  ?

## 2021-08-05 NOTE — Progress Notes (Signed)
? ?  GYNECOLOGY OFFICE VISIT NOTE ? ?History:  ? Kendra Barajas is a 21 y.o. G1P1001 here today for BP check. ? ?Seen for PP check on 07/08/2021, after visit noted to have high BP ?Continued on Nifedipine 30 XL  ? ?Today reports she has been taking medicine daily and consistently ?Been having intermittent headaches for the past two weeks, not taking anything ? ?There are no preventive care reminders to display for this patient. ? ?Past Medical History:  ?Diagnosis Date  ? Anxiety   ? Asthma   ? Depression   ? ? ?Past Surgical History:  ?Procedure Laterality Date  ? Contracted toe Right 08/2019  ? FOOT SURGERY    ? WISDOM TOOTH EXTRACTION    ? ? ?The following portions of the patient's history were reviewed and updated as appropriate: allergies, current medications, past family history, past medical history, past social history, past surgical history and problem list.  ? ?Health Maintenance:   ?Last pap: ?Lab Results  ?Component Value Date  ? DIAGPAP (A) 07/08/2021  ?  - Atypical squamous cells of undetermined significance (ASC-US)  ? Bassett Positive (A) 07/08/2021  ? ?Repeat in 1 year ? ?Last mammogram:  ?N/a  ? ? ?Review of Systems:  ?Pertinent items noted in HPI and remainder of comprehensive ROS otherwise negative. ? ?Physical Exam:  ?BP (!) 155/89   Pulse 91   Wt 136 lb 9.6 oz (62 kg)   BMI 21.39 kg/m?  ?CONSTITUTIONAL: Well-developed, well-nourished female in no acute distress.  ?HEENT:  Normocephalic, atraumatic. External right and left ear normal. No scleral icterus.  ?NECK: Normal range of motion, supple, no masses noted on observation ?SKIN: No rash noted. Not diaphoretic. No erythema. No pallor. ?MUSCULOSKELETAL: Normal range of motion. No edema noted. ?NEUROLOGIC: Alert and oriented to person, place, and time. Normal muscle tone coordination.  ?PSYCHIATRIC: Normal mood and affect. Normal behavior. Normal judgment and thought content. ?RESPIRATORY: Effort normal, no problems with respiration  noted ? ? ?Labs and Imaging ?No results found for this or any previous visit (from the past 168 hour(s)). ?No results found.    ?Assessment and Plan:  ? ?Problem List Items Addressed This Visit   ? ?  ? Cardiovascular and Mediastinum  ? Gestational hypertension - Primary  ?  Ongoing BP elevation, will increase nifedipine and rtc in 2 weeks for BP check. Discussed she may in fact have cHTN but we'll just have to wait and see. Encouraged to use tylenol/ibuprofen for headaches.  ? ?  ?  ? Relevant Medications  ? NIFEdipine (ADALAT CC) 60 MG 24 hr tablet  ? ? ?Routine preventative health maintenance measures emphasized. ?Please refer to After Visit Summary for other counseling recommendations.  ? ?Return in about 2 weeks (around 08/19/2021) for RN BP check.   ? ?Total face-to-face time with patient: 15 minutes.  Over 50% of encounter was spent on counseling and coordination of care. ? ? ?Clarnce Flock, MD/MPH ?Attending Family Medicine Physician, Faculty Practice ?[facility] ? ?

## 2021-08-12 ENCOUNTER — Ambulatory Visit (INDEPENDENT_AMBULATORY_CARE_PROVIDER_SITE_OTHER): Payer: Medicaid Other

## 2021-08-12 VITALS — BP 119/70 | HR 75 | Wt 139.0 lb

## 2021-08-12 DIAGNOSIS — Z0130 Encounter for examination of blood pressure without abnormal findings: Secondary | ICD-10-CM

## 2021-08-12 NOTE — Progress Notes (Signed)
Blood Pressure Check Visit  Kendra Barajas is here for blood pressure check following vaginal delivery on 06/04/21. BP today is 119/70. Patient denies any dizziness, blurred vision, shortness of breath, peripheral edema. Patient takes 60 mg Nifedipine daily. Patient states she took it this morning prior to appointment. Patient states she experiences headaches after taking daily dose of Nifedipine. Per patient headaches do not get better with Tylenol or Ibuprofen. Per patient headaches improve with time throughout the day. Reviewed this with Dr. Dione Plover. Per Dr. Dione Plover patient to stop taking Nifedipine and come back in 2 weeks for a BP recheck. I reviewed this with the patient. Patient verbalized understanding and denies any questions.    Seth Bake, RN 08/12/2021

## 2021-08-26 ENCOUNTER — Ambulatory Visit (INDEPENDENT_AMBULATORY_CARE_PROVIDER_SITE_OTHER): Payer: Medicaid Other

## 2021-08-26 VITALS — BP 129/83 | HR 83 | Wt 139.0 lb

## 2021-08-26 DIAGNOSIS — Z0130 Encounter for examination of blood pressure without abnormal findings: Secondary | ICD-10-CM

## 2021-08-26 NOTE — Progress Notes (Signed)
Pt here today for BP check after the request of discontinuing Nifedipine 30 mg daily for BP per Dr. Dione Plover on 08/12/21 .  Pt denies headaches and visual changes.  BP LA 136/72.  Rpt BP RA 129/83.  Per Dione Plover, MD pt can continue with not taking BP medication however monitor for sx's of elevated BP.  Pt advised providers recommendation and to f/u with [place] with any GYN concerns and annual/pap smears.  Pt verbalized understanding with no further questions.    Kendra Barajas  08/26/21

## 2021-09-09 ENCOUNTER — Encounter: Payer: Self-pay | Admitting: Family Medicine

## 2021-10-07 ENCOUNTER — Other Ambulatory Visit: Payer: Self-pay

## 2021-10-07 ENCOUNTER — Other Ambulatory Visit (HOSPITAL_COMMUNITY)
Admission: RE | Admit: 2021-10-07 | Discharge: 2021-10-07 | Disposition: A | Discharge status: Home / Self Care | Payer: Medicaid Other | Source: Ambulatory Visit | Attending: Family Medicine | Admitting: Family Medicine

## 2021-10-07 ENCOUNTER — Ambulatory Visit (INDEPENDENT_AMBULATORY_CARE_PROVIDER_SITE_OTHER): Payer: Medicaid Other

## 2021-10-07 VITALS — BP 116/77 | HR 71 | Wt 134.5 lb

## 2021-10-07 DIAGNOSIS — N898 Other specified noninflammatory disorders of vagina: Secondary | ICD-10-CM | POA: Diagnosis present

## 2021-10-07 DIAGNOSIS — Z3042 Encounter for surveillance of injectable contraceptive: Secondary | ICD-10-CM

## 2021-10-07 MED ORDER — MEDROXYPROGESTERONE ACETATE 150 MG/ML IM SUSP
150.0000 mg | Freq: Once | INTRAMUSCULAR | Status: AC
  Administered 2021-10-07: 150 mg via INTRAMUSCULAR

## 2021-10-07 NOTE — Progress Notes (Signed)
Kendra Barajas here for Depo-Provera Injection. Injection administered without complication. Patient will return in 3 months for next injection between 12/23/21 and 01/06/22. Next annual visit due April 2024.   Pt reports recently using Lavender scented toilet paper and noticing vaginal irritation afterward. Self swab instructions given and specimen obtained. Explained we will contact pt with any abnormal results.   [name], RN 10/07/2021  1:53 PM

## 2021-10-08 LAB — CERVICOVAGINAL ANCILLARY ONLY
Bacterial Vaginitis (gardnerella): NEGATIVE
Candida Glabrata: NEGATIVE
Candida Vaginitis: NEGATIVE
Chlamydia: NEGATIVE
Comment: NEGATIVE
Comment: NEGATIVE
Comment: NEGATIVE
Comment: NEGATIVE
Comment: NEGATIVE
Comment: NORMAL
Neisseria Gonorrhea: NEGATIVE
Trichomonas: NEGATIVE

## 2021-10-24 ENCOUNTER — Other Ambulatory Visit: Payer: Self-pay | Admitting: Nurse Practitioner

## 2021-11-17 ENCOUNTER — Telehealth: Payer: Self-pay | Admitting: Family Medicine

## 2021-11-17 MED ORDER — PREPLUS 27-1 MG PO TABS: 1.0000 | ORAL_TABLET | Freq: Every day | ORAL | 11 refills | Status: DC

## 2021-11-17 NOTE — Telephone Encounter (Signed)
Prenatal vitamins refilled. Pt notified via phone call.

## 2021-11-17 NOTE — Telephone Encounter (Signed)
Refill on her Prenatal's

## 2021-12-23 ENCOUNTER — Ambulatory Visit (INDEPENDENT_AMBULATORY_CARE_PROVIDER_SITE_OTHER): Payer: Medicaid Other

## 2021-12-23 ENCOUNTER — Other Ambulatory Visit: Payer: Self-pay

## 2021-12-23 VITALS — BP 124/84 | HR 86 | Wt 138.6 lb

## 2021-12-23 DIAGNOSIS — Z3042 Encounter for surveillance of injectable contraceptive: Secondary | ICD-10-CM

## 2021-12-23 MED ORDER — MEDROXYPROGESTERONE ACETATE 150 MG/ML IM SUSP
150.0000 mg | Freq: Once | INTRAMUSCULAR | Status: AC
  Administered 2021-12-23: 150 mg via INTRAMUSCULAR

## 2021-12-23 NOTE — Progress Notes (Signed)
Kendra Barajas here for Depo-Provera Injection. Injection administered without complication. Patient will return in 3 months for next injection between 03/10/22 and 03/24/22. Next annual visit due April 2024.   [name], RN 12/23/2021  1:37 PM

## 2021-12-28 IMAGING — US US OB < 14 WEEKS - US OB TV
1 series · 15 of 28 positions shown · non-contrast
Comparison: None.

CLINICAL DATA: Pelvic pain on the left, positive pregnancy test

EXAM:
OBSTETRIC <14 WK US AND TRANSVAGINAL OB US
TECHNIQUE: Both transabdominal and transvaginal ultrasound examinations were
performed for complete evaluation of the gestation as well as the
maternal uterus, adnexal regions, and pelvic cul-de-sac.
Transvaginal technique was performed to assess early pregnancy.

[Series 1: us ob < 14 weeks - us ob tv · 15 of 38 slices shown]
[im 1/38]
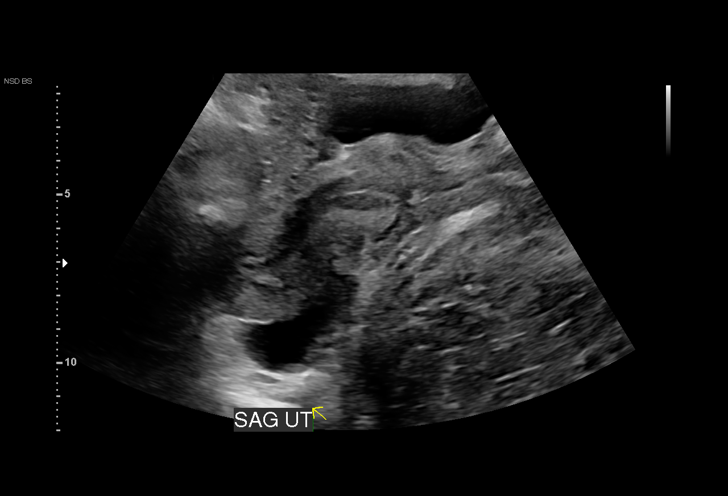
[im 3/38]
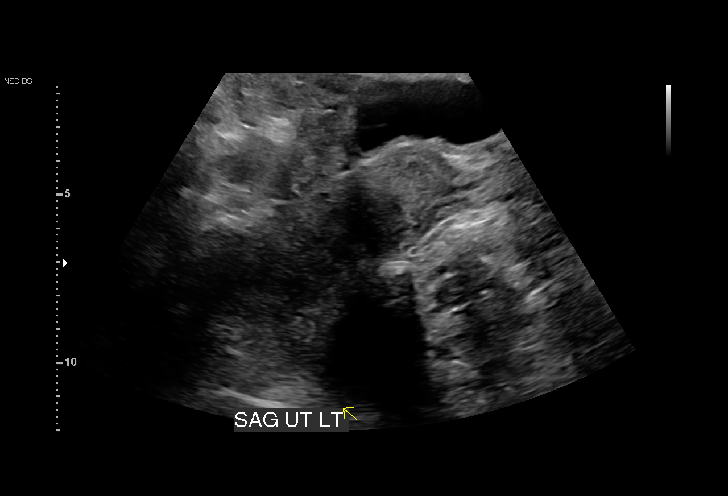
[im 6/38]
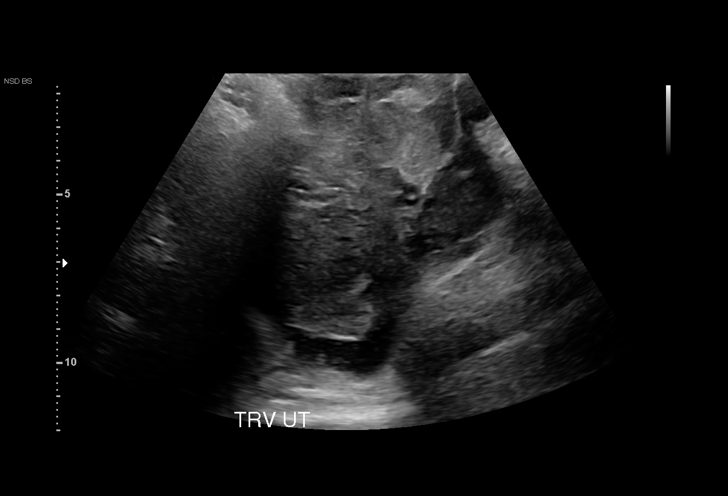
[im 9/38]
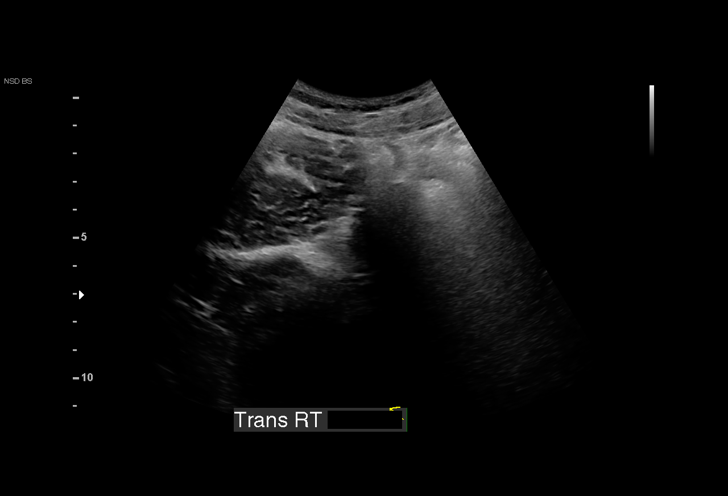
[im 11/38]
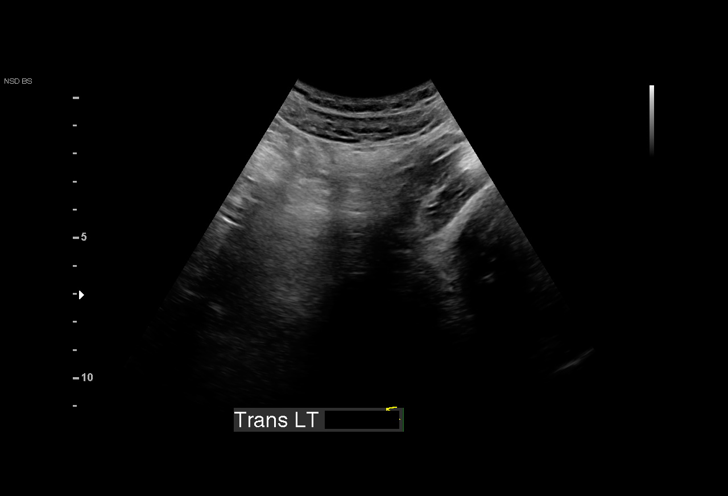
[im 14/38]
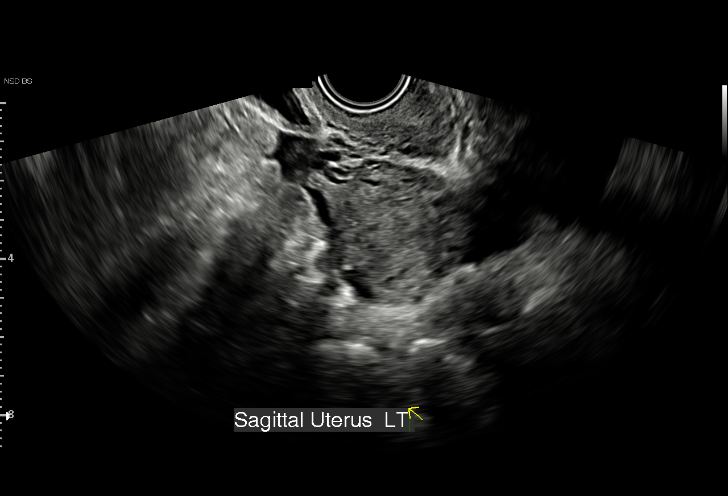
[im 17/38]
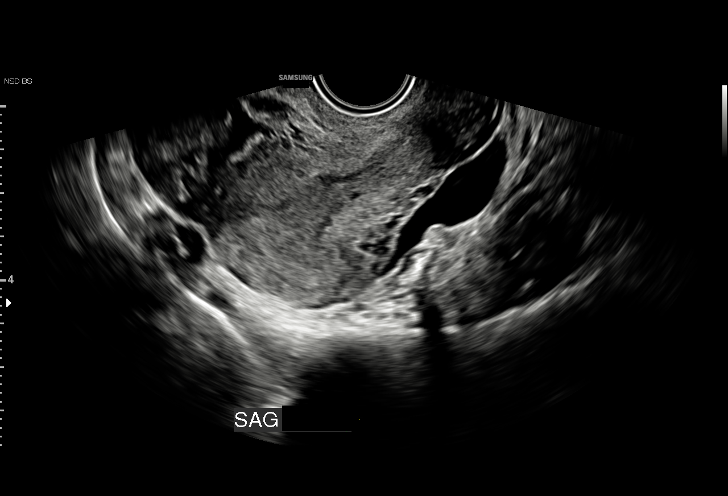
[im 20/38]
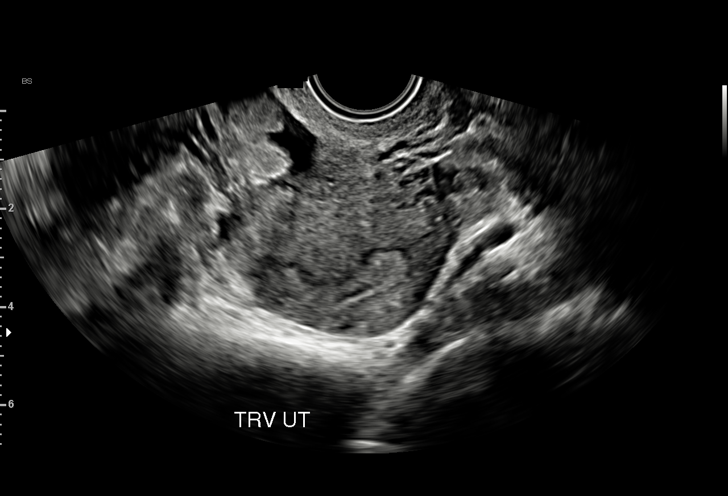
[im 21/38]
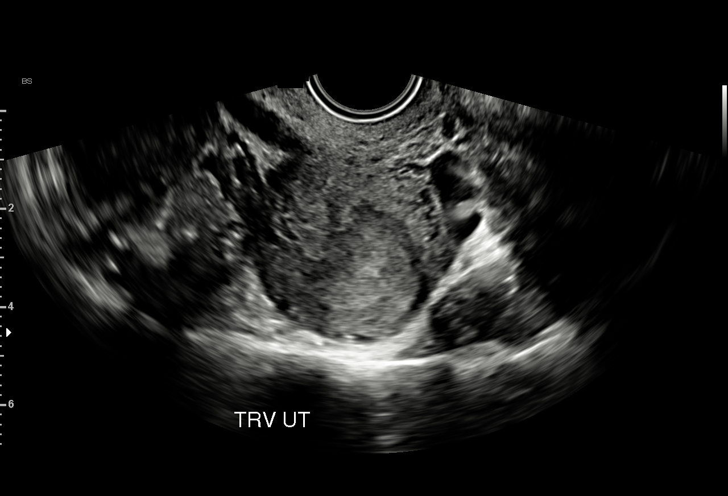
[im 24/38]
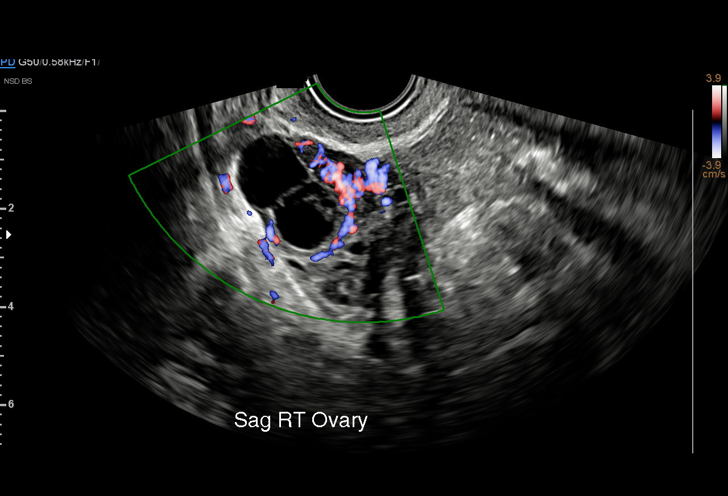
[im 27/38]
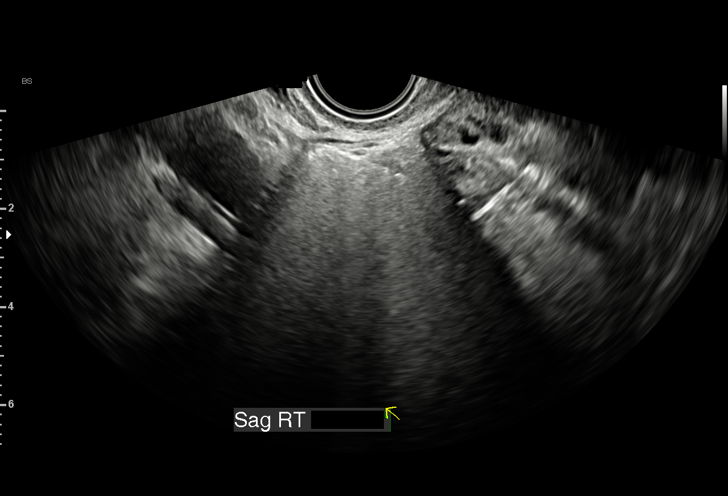
[im 29/38]
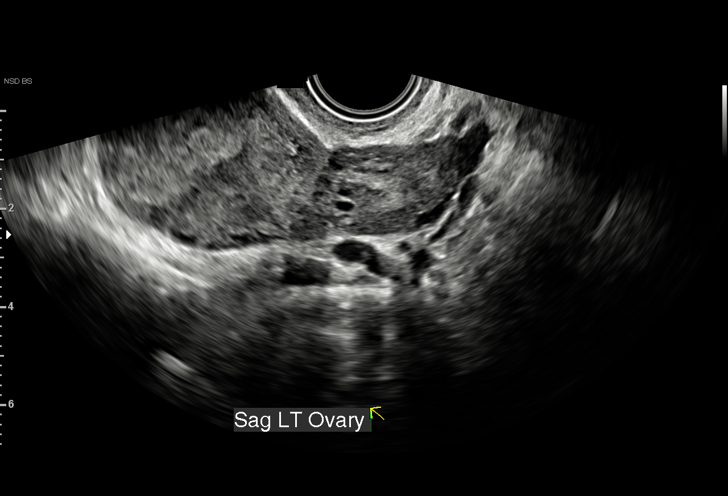
[im 32/38]
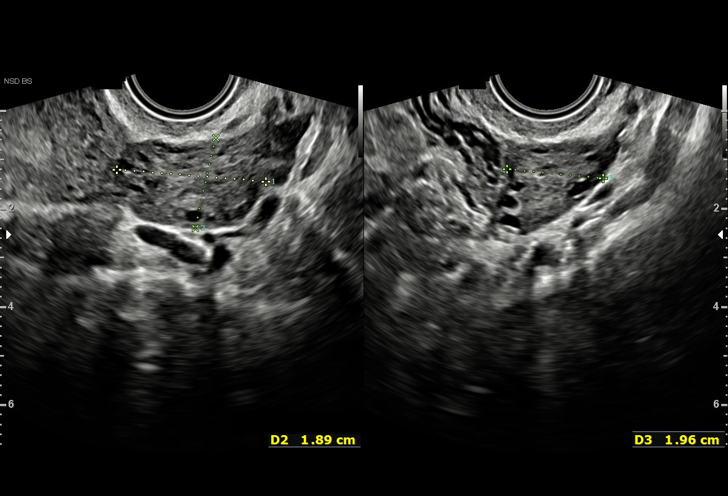
[im 35/38]
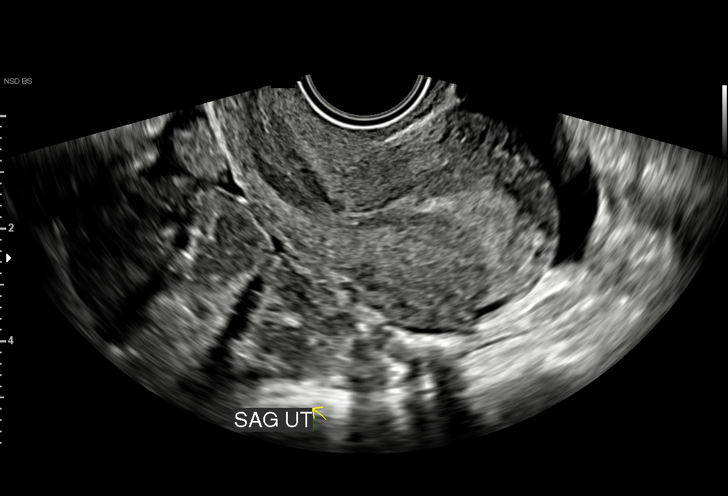
[im 38/38]
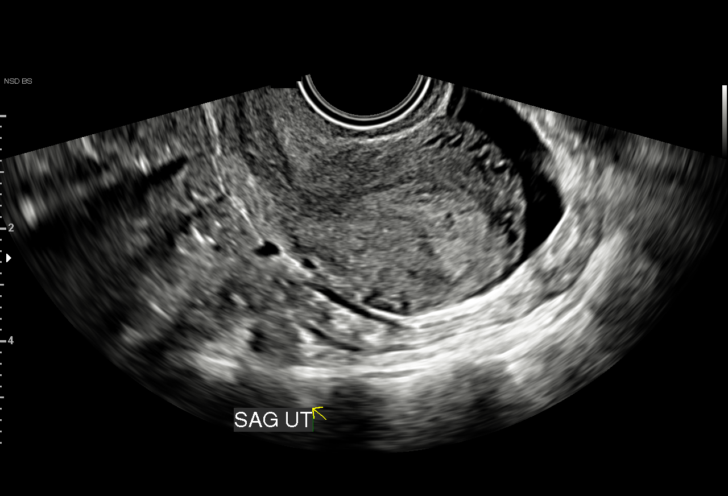

[15 of 28 positions shown; findings below may reference images not displayed]

FINDINGS: Intrauterine gestational sac: Absent

Maternal uterus/adnexae: Uterus is well visualized and within normal
limits. Ovaries show a cystic changes on the right. Mild free fluid
is noted.
IMPRESSION: Mild thickening of the endometrium although no gestational sac is
noted. Correlate with quantitative beta HCG level. Recommend
follow-up quantitative B-HCG levels and follow-up US in 14-21 days
to assess viability as necessary. This recommendation follows SRU
consensus guidelines: Diagnostic Criteria for Nonviable Pregnancy
Early in the First Trimester. N Engl J Med 8570; [DATE].

## 2022-01-25 ENCOUNTER — Encounter: Payer: Self-pay | Admitting: Family Medicine

## 2022-02-22 ENCOUNTER — Encounter: Payer: Self-pay | Admitting: Family Medicine

## 2022-03-10 ENCOUNTER — Ambulatory Visit (INDEPENDENT_AMBULATORY_CARE_PROVIDER_SITE_OTHER): Payer: Medicaid Other

## 2022-03-10 ENCOUNTER — Other Ambulatory Visit: Payer: Self-pay

## 2022-03-10 DIAGNOSIS — Z3042 Encounter for surveillance of injectable contraceptive: Secondary | ICD-10-CM

## 2022-03-10 MED ORDER — MEDROXYPROGESTERONE ACETATE 150 MG/ML IM SUSP: 150.0000 mg | Freq: Once | INTRAMUSCULAR | Status: AC

## 2022-03-10 NOTE — Progress Notes (Signed)
Ziona Xiang here for Depo-Provera Injection. Injection administered without complication. Patient will return in 3 months for next injection between 02/28 and 03/14. Next annual visit due April 2024.   Martina Sinner, RN 03/10/2022  2:01 PM

## 2022-03-17 ENCOUNTER — Encounter: Payer: Self-pay | Admitting: Family Medicine

## 2022-04-13 ENCOUNTER — Emergency Department (HOSPITAL_BASED_OUTPATIENT_CLINIC_OR_DEPARTMENT_OTHER)
Admission: EM | Admit: 2022-04-13 | Discharge: 2022-04-13 | Disposition: A | Discharge status: Home / Self Care | Payer: Medicaid Other | Attending: Emergency Medicine | Admitting: Emergency Medicine

## 2022-04-13 DIAGNOSIS — H5789 Other specified disorders of eye and adnexa: Secondary | ICD-10-CM | POA: Diagnosis not present

## 2022-04-13 MED ORDER — LORATADINE 10 MG PO TABS
10.0000 mg | ORAL_TABLET | Freq: Once | ORAL | Status: AC
  Administered 2022-04-13: 10 mg via ORAL
  Filled 2022-04-13: qty 1

## 2022-04-13 MED ORDER — ERYTHROMYCIN 5 MG/GM OP OINT: TOPICAL_OINTMENT | OPHTHALMIC | 0 refills | Status: DC

## 2022-04-13 NOTE — ED Triage Notes (Signed)
Pt c/o itching, burning in both eyes, onset last Monday. Clear eyes drops w no relief. Denies visual changes, denies glasses/ contacts

## 2022-04-13 NOTE — ED Provider Notes (Signed)
[facility] EMERGENCY DEPT Provider Note   CSN: [id number] Arrival date & time: 04/13/22  0155     History  Chief Complaint  Patient presents with   Eye Pain         Kendra Barajas is a 22 y.o. female.  The history is provided by the patient.  Eye Pain This is a new problem. The current episode started more than 1 week ago. The problem occurs constantly. The problem has not changed since onset.Nothing aggravates the symptoms. Nothing relieves the symptoms. Treatments tried: oTC eye drops. The treatment provided no relief.  Patient with a sensation of burning in B eyes since Monday of last week.  Does not wear contacts or glasses.       Home Medications Prior to Admission medications   Medication Sig Start Date End Date Taking? Authorizing Provider  erythromycin ophthalmic ointment Place a 1/2 inch ribbon of ointment into the lower eyelid. 04/13/22  Yes Hosam Mcfetridge, MD  fluticasone [facility]) 50 MCG/ACT nasal spray Place 1 spray into both nostrils daily. Patient not taking: Reported on 03/10/2022 07/21/21   Clarnce Flock, MD  loratadine (CLARITIN) 10 MG tablet Take 1 tablet (10 mg total) by mouth daily. Patient not taking: Reported on 03/10/2022 07/21/21   Clarnce Flock, MD  Prenatal Vit-Fe Fumarate-FA (PREPLUS) 27-1 MG TABS Take 1 tablet by mouth daily. 11/17/21   Clarnce Flock, MD      Allergies    Patient has no known allergies.    Review of Systems   Review of Systems  Constitutional:  Negative for fever.  HENT:  Negative for facial swelling.   Eyes:  Positive for pain. Negative for photophobia, redness and visual disturbance.  Respiratory:  Negative for wheezing and stridor.   All other systems reviewed and are negative.   Physical Exam Updated Vital Signs BP 113/69   Pulse 66   Temp 98 F (36.7 C) (Oral)   Resp 16   SpO2 98%  Physical Exam Vitals and nursing note reviewed.  Constitutional:      General: She is not in acute  distress.    Appearance: Normal appearance. She is well-developed.  HENT:     Head: Normocephalic and atraumatic.     Nose: Nose normal.  Eyes:     Extraocular Movements: Extraocular movements intact.     Conjunctiva/sclera: Conjunctivae normal.     Pupils: Pupils are equal, round, and reactive to light.  Cardiovascular:     Rate and Rhythm: Normal rate and regular rhythm.     Pulses: Normal pulses.     Heart sounds: Normal heart sounds.  Pulmonary:     Effort: Pulmonary effort is normal. No respiratory distress.     Breath sounds: Normal breath sounds.  Abdominal:     General: Bowel sounds are normal. There is no distension.     Palpations: Abdomen is soft.     Tenderness: There is no abdominal tenderness. There is no guarding or rebound.  Genitourinary:    Vagina: No vaginal discharge.  Musculoskeletal:        General: Normal range of motion.     Cervical back: Normal range of motion and neck supple.  Skin:    General: Skin is dry.     Capillary Refill: Capillary refill takes less than 2 seconds.     Findings: No erythema or rash.  Neurological:     General: No focal deficit present.     Mental Status: She is alert.  Deep Tendon Reflexes: Reflexes normal.  Psychiatric:        Mood and Affect: Mood normal.        Behavior: Behavior normal.     ED Results / Procedures / Treatments   Labs (all labs ordered are listed, but only abnormal results are displayed) Labs Reviewed - No data to display  EKG None  Radiology No results found.  Procedures Procedures    Medications Ordered in ED Medications  loratadine (CLARITIN) tablet 10 mg (10 mg Oral Given 04/13/22 0208)    ED Course/ Medical Decision Making/ A&P                             Medical Decision Making Patient with eye burning B x 8 days   Amount and/or Complexity of Data Reviewed External Data Reviewed: notes.    Details: Previous notes reviewed   Risk OTC drugs. Prescription drug  management. Risk Details: Well appearing.  Eyes appear normal on exam.  No crusting no redness.  Likely allergic.  Have recommended claritin and will startr E-mycin ointment to soothe.  Follow up with ophthalmology for ongoing care.      Final Clinical Impression(s) / ED Diagnoses Final diagnoses:  Burning sensation of eye  Return for intractable cough, coughing up blood, fevers > 100.4 unrelieved by medication, shortness of breath, intractable vomiting, chest pain, shortness of breath, weakness, numbness, changes in speech, facial asymmetry, abdominal pain, passing out, Inability to tolerate liquids or food, cough, altered mental status or any concerns. No signs of systemic illness or infection. The patient is nontoxic-appearing on exam and vital signs are within normal limits.  I have reviewed the triage vital signs and the nursing notes. Pertinent labs & imaging results that were available during my care of the patient were reviewed by me and considered in my medical decision making (see chart for details). After history, exam, and medical workup I feel the patient has been appropriately medically screened and is safe for discharge home. Pertinent diagnoses were discussed with the patient. Patient was given return precautions.   Rx / DC Orders ED Discharge Orders          Ordered    erythromycin ophthalmic ointment        04/13/22 0204              Josee Speece, MD 04/13/22 0277

## 2022-04-22 ENCOUNTER — Telehealth: Payer: Self-pay | Admitting: Family Medicine

## 2022-04-22 NOTE — Telephone Encounter (Signed)
Returned call to patient, wanting to be seen for vaginal discomfort and irritation. Let patient know of available appointments, Kendra Barajas needs to check with mom as they share car right now. Will sending mychart message back letting us know what works best for her.

## 2022-04-22 NOTE — Telephone Encounter (Signed)
Patient would like an appt with [facility] because there is irritation in her vaginal area.

## 2022-05-26 ENCOUNTER — Ambulatory Visit: Payer: Medicaid Other

## 2022-06-02 ENCOUNTER — Emergency Department (HOSPITAL_BASED_OUTPATIENT_CLINIC_OR_DEPARTMENT_OTHER)
Admission: EM | Admit: 2022-06-02 | Discharge: 2022-06-02 | Disposition: A | Discharge status: Home / Self Care | Payer: Medicaid Other | Source: Home / Self Care | Attending: Emergency Medicine | Admitting: Emergency Medicine

## 2022-06-02 ENCOUNTER — Encounter (HOSPITAL_BASED_OUTPATIENT_CLINIC_OR_DEPARTMENT_OTHER): Payer: Self-pay

## 2022-06-02 ENCOUNTER — Emergency Department (HOSPITAL_BASED_OUTPATIENT_CLINIC_OR_DEPARTMENT_OTHER)
Admission: EM | Admit: 2022-06-02 | Discharge: 2022-06-02 | Disposition: A | Discharge status: Home / Self Care | Payer: Medicaid Other | Attending: Emergency Medicine | Admitting: Emergency Medicine

## 2022-06-02 ENCOUNTER — Encounter (HOSPITAL_BASED_OUTPATIENT_CLINIC_OR_DEPARTMENT_OTHER): Payer: Self-pay | Admitting: Emergency Medicine

## 2022-06-02 ENCOUNTER — Emergency Department (HOSPITAL_BASED_OUTPATIENT_CLINIC_OR_DEPARTMENT_OTHER): Payer: Medicaid Other

## 2022-06-02 ENCOUNTER — Other Ambulatory Visit: Payer: Self-pay

## 2022-06-02 DIAGNOSIS — K529 Noninfective gastroenteritis and colitis, unspecified: Secondary | ICD-10-CM | POA: Insufficient documentation

## 2022-06-02 DIAGNOSIS — R112 Nausea with vomiting, unspecified: Secondary | ICD-10-CM | POA: Diagnosis present

## 2022-06-02 DIAGNOSIS — Z20822 Contact with and (suspected) exposure to covid-19: Secondary | ICD-10-CM | POA: Diagnosis not present

## 2022-06-02 LAB — COMPREHENSIVE METABOLIC PANEL
ALT: 16 U/L (ref 0–44)
AST: 21 U/L (ref 15–41)
Albumin: 4.9 g/dL (ref 3.5–5.0)
Alkaline Phosphatase: 83 U/L (ref 38–126)
Anion gap: 10 (ref 5–15)
BUN: 15 mg/dL (ref 6–20)
CO2: 24 mmol/L (ref 22–32)
Calcium: 9.6 mg/dL (ref 8.9–10.3)
Chloride: 106 mmol/L (ref 98–111)
Creatinine, Ser: 0.95 mg/dL (ref 0.44–1.00)
GFR, Estimated: >60 mL/min (ref >60)
Glucose, Bld: 97 mg/dL (ref 70–99)
Potassium: 3.7 mmol/L (ref 3.5–5.1)
Sodium: 140 mmol/L (ref 135–145)
Total Bilirubin: 0.8 mg/dL (ref 0.3–1.2)
Total Protein: 8 g/dL (ref 6.5–8.1)

## 2022-06-02 LAB — URINALYSIS, ROUTINE W REFLEX MICROSCOPIC
Bilirubin Urine: NEGATIVE
Glucose, UA: NEGATIVE mg/dL
Hgb urine dipstick: NEGATIVE
Ketones, ur: 15 mg/dL — AB
Leukocytes,Ua: NEGATIVE
Nitrite: NEGATIVE
Protein, ur: NEGATIVE mg/dL
Specific Gravity, Urine: 1.024 (ref 1.005–1.030)
pH: 5 (ref 5.0–8.0)

## 2022-06-02 LAB — RESP PANEL BY RT-PCR (RSV, FLU A&B, COVID)  RVPGX2
Influenza A by PCR: NEGATIVE
Influenza B by PCR: NEGATIVE
Resp Syncytial Virus by PCR: NEGATIVE
SARS Coronavirus 2 by RT PCR: NEGATIVE

## 2022-06-02 LAB — CBC WITH DIFFERENTIAL/PLATELET
Abs Immature Granulocytes: 0.03 10*3/uL (ref 0.00–0.07)
Basophils Absolute: 0 10*3/uL (ref 0.0–0.1)
Basophils Relative: 0 %
Eosinophils Absolute: 0 10*3/uL (ref 0.0–0.5)
Eosinophils Relative: 0 %
HCT: 40.6 % (ref 36.0–46.0)
Hemoglobin: 13.7 g/dL (ref 12.0–15.0)
Immature Granulocytes: 0 %
Lymphocytes Relative: 6 %
Lymphs Abs: 0.7 10*3/uL (ref 0.7–4.0)
MCH: 29.8 pg (ref 26.0–34.0)
MCHC: 33.7 g/dL (ref 30.0–36.0)
MCV: 88.3 fL (ref 80.0–100.0)
Monocytes Absolute: 0.4 10*3/uL (ref 0.1–1.0)
Monocytes Relative: 3 %
Neutro Abs: 10.1 10*3/uL — ABNORMAL HIGH (ref 1.7–7.7)
Neutrophils Relative %: 91 %
Platelets: 212 10*3/uL (ref 150–400)
RBC: 4.6 MIL/uL (ref 3.87–5.11)
RDW: 12.3 % (ref 11.5–15.5)
WBC: 11.2 10*3/uL — ABNORMAL HIGH (ref 4.0–10.5)
nRBC: 0 % (ref 0.0–0.2)

## 2022-06-02 LAB — PREGNANCY, URINE: Preg Test, Ur: NEGATIVE

## 2022-06-02 LAB — LIPASE, BLOOD: Lipase: 11 U/L (ref 11–51)

## 2022-06-02 MED ORDER — ONDANSETRON HCL 4 MG/2ML IJ SOLN
4.0000 mg | Freq: Once | INTRAMUSCULAR | Status: AC
  Administered 2022-06-02: 4 mg via INTRAVENOUS
  Filled 2022-06-02: qty 2

## 2022-06-02 MED ORDER — IOHEXOL 300 MG/ML  SOLN
100.0000 mL | Freq: Once | INTRAMUSCULAR | Status: AC | PRN
  Administered 2022-06-02: 80 mL via INTRAVENOUS

## 2022-06-02 MED ORDER — KETOROLAC TROMETHAMINE 15 MG/ML IJ SOLN
15.0000 mg | Freq: Once | INTRAMUSCULAR | Status: AC
  Administered 2022-06-02: 15 mg via INTRAVENOUS
  Filled 2022-06-02: qty 1

## 2022-06-02 MED ORDER — ONDANSETRON 4 MG PO TBDP: 4.0000 mg | ORAL_TABLET | Freq: Three times a day (TID) | ORAL | 0 refills | Status: DC | PRN

## 2022-06-02 MED ORDER — SODIUM CHLORIDE 0.9 % IV BOLUS
1000.0000 mL | Freq: Once | INTRAVENOUS | Status: AC
  Administered 2022-06-02: 1000 mL via INTRAVENOUS

## 2022-06-02 NOTE — ED Triage Notes (Signed)
Pt reports emesis, diarrhea, and body aches. Denies abdominal pain.

## 2022-06-02 NOTE — Discharge Instructions (Signed)
As we discussed, your workup in the ER today was reassuring for acute findings.  Laboratory evaluation did not reveal any emergent concerns.  Given that you are feeling better after medications and fluids, I suspect that your symptoms are due to gastroenteritis which is likely caused by a virus.  I have given you a prescription for Zofran which is an abdominal nausea medication for you to take as prescribed as needed for any residual nausea or vomiting.  Is very important that you maintain adequate hydration and follow-up with your primary care doctor as needed.  Return if development of any new or worsening symptoms.

## 2022-06-02 NOTE — ED Notes (Signed)
Reviewed AVS/discharge instruction with patient. Time allotted for and all questions answered. Patient is agreeable for d/c and escorted to ed exit by staff.  

## 2022-06-02 NOTE — ED Provider Notes (Signed)
[facility] Provider Note   CSN: [id number] Arrival date & time: 06/02/22  1459     History  Chief Complaint  Patient presents with   Emesis    Kendra Barajas is a 22 y.o. female.  Patient with noncontributory past medical history presents today with complaints of nausea, vomiting, and diarrhea. She states that same began this morning and has been persistent since then. She endorses some mild abdominal cramping throughout her abdomen but no significant pain. She has not been able to tolerate any intake since this morning due to consistent nausea and vomiting.  She denies any hematochezia or hematemesis.  No hematuria or dysuria, or vaginal discharge.  No known sick contacts.  No history of abdominal surgeries. She has not eaten anything suspicious recently.  Denies any history of similar symptoms previously.  Of note, patient is breast-feeding.  The history is provided by the patient. No language interpreter was used.  Emesis Associated symptoms: diarrhea        Home Medications Prior to Admission medications   Medication Sig Start Date End Date Taking? Authorizing Provider  erythromycin ophthalmic ointment Place a 1/2 inch ribbon of ointment into the lower eyelid. 04/13/22   Palumbo, April, MD  fluticasone (FLONASE) 50 MCG/ACT nasal spray Place 1 spray into both nostrils daily. Patient not taking: Reported on 03/10/2022 07/21/21   Clarnce Flock, MD  loratadine (CLARITIN) 10 MG tablet Take 1 tablet (10 mg total) by mouth daily. Patient not taking: Reported on 03/10/2022 07/21/21   Clarnce Flock, MD  Prenatal Vit-Fe Fumarate-FA (PREPLUS) 27-1 MG TABS Take 1 tablet by mouth daily. 11/17/21   Clarnce Flock, MD      Allergies    Patient has no known allergies.    Review of Systems   Review of Systems  Gastrointestinal:  Positive for diarrhea, nausea and vomiting.  All other systems reviewed and are negative.   Physical  Exam Updated Vital Signs BP 123/76   Pulse (!) 107   Temp 99 F (37.2 C)   Resp 17   SpO2 98%  Physical Exam Vitals and nursing note reviewed.  Constitutional:      General: She is not in acute distress.    Appearance: Normal appearance. She is normal weight. She is not ill-appearing, toxic-appearing or diaphoretic.  HENT:     Head: Normocephalic and atraumatic.  Cardiovascular:     Rate and Rhythm: Normal rate.  Pulmonary:     Effort: Pulmonary effort is normal. No respiratory distress.  Abdominal:     General: Abdomen is flat.     Palpations: Abdomen is soft.     Tenderness: There is no abdominal tenderness.  Musculoskeletal:        General: Normal range of motion.     Cervical back: Normal range of motion.  Skin:    General: Skin is warm and dry.  Neurological:     General: No focal deficit present.     Mental Status: She is alert.  Psychiatric:        Mood and Affect: Mood normal.        Behavior: Behavior normal.     ED Results / Procedures / Treatments   Labs (all labs ordered are listed, but only abnormal results are displayed) Labs Reviewed  RESP PANEL BY RT-PCR (RSV, FLU A&B, COVID)  [id number]  COMPREHENSIVE METABOLIC PANEL  LIPASE, BLOOD  CBC WITH DIFFERENTIAL/PLATELET  URINALYSIS, ROUTINE W REFLEX MICROSCOPIC  PREGNANCY,  URINE    EKG None  Radiology No results found.  Procedures Procedures    Medications Ordered in ED Medications  sodium chloride 0.9 % bolus 1,000 mL (has no administration in time range)  ondansetron (ZOFRAN) injection 4 mg (has no administration in time range)    ED Course/ Medical Decision Making/ A&P                             Medical Decision Making Amount and/or Complexity of Data Reviewed Labs: ordered.  Risk Prescription drug management.   This patient is a 22 y.o. female who presents to the ED for concern of nausea, vomiting, and diarrhea, this involves an extensive number of treatment options, and is a  complaint that carries with it a high risk of complications and morbidity. The emergent differential diagnosis prior to evaluation includes, but is not limited to, gastroenteritis, appendicitis, Bowel obstruction, Bowel perforation. Gastroparesis, DKA, Hernia, Inflammatory bowel disease. This is not an exhaustive differential.   Past Medical History / Co-morbidities / Social History: N/A  Physical Exam: Physical exam performed. The pertinent findings include: abdomen soft and nontender  Lab Tests: I ordered, and personally interpreted labs.  The pertinent results include:  WBC 11.2, UA with ketones. No other acute laboratory findings   Medications: I ordered medication including fluids and zofran  for nausea, vomiting, diarrhea. Reevaluation of the patient after these medicines showed that the patient improved. I have reviewed the patients home medicines and have made adjustments as needed.   Disposition:  Patient presents today with complaints of nausea, vomiting, and diarrhea.  She is afebrile, nontoxic-appearing, and in no acute distress with reassuring vital signs.  Her abdomen is soft and nontender. Patient is nontoxic, nonseptic appearing, in no apparent distress.  Patient's pain and other symptoms adequately managed in emergency department.  Fluid bolus given.  Labs and vitals reviewed.  Patient does not meet the SIRS or Sepsis criteria.  On repeat exam patient does not have a surgical abdomin and there are no peritoneal signs.  After fluids and Zofran, patient states that her symptoms have significantly improved.  Given this no indication for imaging at this time.  Laboratory evaluation benign, very low suspicion of appendicitis, bowel obstruction, bowel perforation, cholecystitis, diverticulitis, PID or ectopic pregnancy.  Suspect symptoms are related to gastroenteritis, likely viral.  Evaluation and diagnostic testing in the emergency department does not suggest an emergent condition  requiring admission or immediate intervention beyond what has been performed at this time.  Plan for discharge with close PCP follow-up.  Patient discharged home Zofran with symptomatic treatment. Patient is understanding and amenable with plan, educated on red flag symptoms that would prompt immediate return.  Patient discharged in stable condition.   Final Clinical Impression(s) / ED Diagnoses Final diagnoses:  Gastroenteritis    Rx / DC Orders ED Discharge Orders          Ordered    ondansetron (ZOFRAN-ODT) 4 MG disintegrating tablet  Every 8 hours PRN        06/02/22 1649          An After Visit Summary was printed and given to the patient.     Nestor Lewandowsky 06/02/22 1651    Fredia Sorrow, MD 06/03/22 2202

## 2022-06-02 NOTE — ED Triage Notes (Signed)
Patient here POV from Home.  Endorses N/V/D and ABD Pain that began this AM. Seen for Same and discharged 2 hours ago. Seeks Evaluation for returned Pain.   NAD Noted during Triage. A&Ox4. GCS 15. Ambulatory.

## 2022-06-02 NOTE — ED Notes (Signed)
Patient notified that urine is needed to complete eval. Patient educated on clean catch procedure and specimen cup left as a reminder.  Call bell in reach patient comfortable at this time.

## 2022-06-02 NOTE — ED Provider Notes (Signed)
[facility] Provider Note   CSN: [id number] Arrival date & time: 06/02/22  1858     History  Chief Complaint  Patient presents with   Abdominal Pain    Kendra Barajas is a 22 y.o. female.  Patient returns today with complaints of abdominal pain.  She was seen here by me approximately 2 hours ago and was diagnosed with viral gastroenteritis due to nausea, vomiting, and diarrhea and discharged home with Zofran.  At time of discharge, patient states that her pain was mild.  States that since her discharge her pain has become more severe and she returns for same.  She denies any additional nausea, vomiting, or diarrhea.  Pain is in the periumbilical region and does not radiate.  The history is provided by the patient. No language interpreter was used.  Abdominal Pain      Home Medications Prior to Admission medications   Medication Sig Start Date End Date Taking? Authorizing Provider  erythromycin ophthalmic ointment Place a 1/2 inch ribbon of ointment into the lower eyelid. 04/13/22   Palumbo, April, MD  fluticasone (FLONASE) 50 MCG/ACT nasal spray Place 1 spray into both nostrils daily. Patient not taking: Reported on 03/10/2022 07/21/21   Clarnce Flock, MD  loratadine (CLARITIN) 10 MG tablet Take 1 tablet (10 mg total) by mouth daily. Patient not taking: Reported on 03/10/2022 07/21/21   Clarnce Flock, MD  ondansetron (ZOFRAN-ODT) 4 MG disintegrating tablet Take 1 tablet (4 mg total) by mouth every 8 (eight) hours as needed for nausea or vomiting. 06/02/22   Livingston Denner, Leary Roca, PA-C  Prenatal Vit-Fe Fumarate-FA (PREPLUS) 27-1 MG TABS Take 1 tablet by mouth daily. 11/17/21   Clarnce Flock, MD      Allergies    Patient has no known allergies.    Review of Systems   Review of Systems  Gastrointestinal:  Positive for abdominal pain.  All other systems reviewed and are negative.   Physical Exam Updated Vital Signs BP 122/75 (BP  Location: Right Arm)   Pulse 93   Temp 100.1 F (37.8 C) (Oral)   Resp 18   Ht '5\' 7"'$  (1.702 m)   Wt 64.4 kg   SpO2 98%   BMI 22.24 kg/m  Physical Exam Vitals and nursing note reviewed.  Constitutional:      General: She is not in acute distress.    Appearance: Normal appearance. She is normal weight. She is not ill-appearing, toxic-appearing or diaphoretic.  HENT:     Head: Normocephalic and atraumatic.  Cardiovascular:     Rate and Rhythm: Normal rate.  Pulmonary:     Effort: Pulmonary effort is normal. No respiratory distress.  Abdominal:     General: Abdomen is flat.     Palpations: Abdomen is soft.     Tenderness: There is abdominal tenderness in the periumbilical area.  Musculoskeletal:        General: Normal range of motion.     Cervical back: Normal range of motion.  Skin:    General: Skin is warm and dry.  Neurological:     General: No focal deficit present.     Mental Status: She is alert.  Psychiatric:        Mood and Affect: Mood normal.        Behavior: Behavior normal.     ED Results / Procedures / Treatments   Labs (all labs ordered are listed, but only abnormal results are displayed) Labs Reviewed -  No data to display  EKG None  Radiology CT ABDOMEN PELVIS W CONTRAST  Result Date: 06/02/2022 CLINICAL DATA:  Nausea, vomiting, diarrhea and abdominal pain. EXAM: CT ABDOMEN AND PELVIS WITH CONTRAST TECHNIQUE: Multidetector CT imaging of the abdomen and pelvis was performed using the standard protocol following bolus administration of intravenous contrast. RADIATION DOSE REDUCTION: This exam was performed according to the departmental dose-optimization program which includes automated exposure control, adjustment of the mA and/or kV according to patient size and/or use of iterative reconstruction technique. CONTRAST:  45m OMNIPAQUE IOHEXOL 300 MG/ML  SOLN COMPARISON:  December 02, 2018 FINDINGS: Lower chest: No acute abnormality. Hepatobiliary: No focal  liver abnormality is seen. No gallstones, gallbladder wall thickening, or biliary dilatation. Pancreas: Unremarkable. No pancreatic ductal dilatation or surrounding inflammatory changes. Spleen: Normal in size without focal abnormality. Adrenals/Urinary Tract: Adrenal glands are unremarkable. Kidneys are normal, without renal calculi, focal lesion, or hydronephrosis. The urinary bladder is poorly distended and subsequently limited in evaluation. Stomach/Bowel: Stomach is within normal limits. Appendix appears normal. No evidence of bowel wall thickening, distention, or inflammatory changes. Vascular/Lymphatic: No significant vascular findings are present. No enlarged abdominal or pelvic lymph nodes. Reproductive: Uterus and bilateral adnexa are unremarkable. Other: No abdominal wall hernia or abnormality. No abdominopelvic ascites. Musculoskeletal: No acute or significant osseous findings. IMPRESSION: No acute or active process within the abdomen or pelvis. Electronically Signed   By: TVirgina NorfolkM.D.   On: 06/02/2022 21:02    Procedures Procedures    Medications Ordered in ED Medications  ketorolac (TORADOL) 15 MG/ML injection 15 mg (15 mg Intravenous Given 06/02/22 2038)  iohexol (OMNIPAQUE) 300 MG/ML solution 100 mL (80 mLs Intravenous Contrast Given 06/02/22 2052)    ED Course/ Medical Decision Making/ A&P                             Medical Decision Making Amount and/or Complexity of Data Reviewed Radiology: ordered.  Risk Prescription drug management.   Patient returns today with complaints of abdominal pain.  She was seen by me approximately 2 hours ago and had benign laboratory evaluation.  Suspected symptoms were due to gastroenteritis, likely viral etiology.  She was able to eat and drink without any subsequent episodes of nausea or vomiting prior to her discharge and declined pain medication stating her pain was mild and she was breast-feeding.  She states that since her  discharge her pain is gotten worse.  Physical exam reveals mild periumbilical tenderness to palpation.  Given that her labs were obtained only a few hours ago, no indication to repeat labs.  CT imaging obtained which revealed no acute findings.  I personally reviewed and interpreted this imaging and agree with radiology interpretation.  Patient given Toradol with symptomatic improvement.  She now feels stable to go home. Evaluation and diagnostic testing in the emergency department does not suggest an emergent condition requiring admission or immediate intervention beyond what has been performed at this time.  Plan for discharge with close PCP follow-up.  Patient is understanding and amenable with plan, educated on red flag symptoms that would prompt immediate return.  Patient discharged in stable condition.   Final Clinical Impression(s) / ED Diagnoses Final diagnoses:  Gastroenteritis    Rx / DC Orders ED Discharge Orders     None     An After Visit Summary was printed and given to the patient.     SNestor Lewandowsky03/06/24  2123    Fredia Sorrow, MD 06/03/22 2204

## 2022-06-02 NOTE — ED Notes (Signed)
Patient ambulated to restroom. Urine sample obtained

## 2022-06-02 NOTE — Discharge Instructions (Signed)
As we discussed your CT scan did not show any emergent concerns.  Follow-up with your primary care doctor and continue to take the Zofran I gave you earlier for management of nausea and vomiting.  You may also take Tylenol/ibuprofen as needed for pain.  Return if development of any new or worsening symptoms.

## 2022-06-02 NOTE — ED Notes (Signed)
Report given to the next RN.Marland KitchenMarland Kitchen

## 2022-06-07 ENCOUNTER — Encounter: Payer: Medicaid Other | Admitting: Family Medicine

## 2022-06-09 ENCOUNTER — Ambulatory Visit (INDEPENDENT_AMBULATORY_CARE_PROVIDER_SITE_OTHER): Payer: Medicaid Other | Admitting: Family Medicine

## 2022-06-09 ENCOUNTER — Other Ambulatory Visit: Payer: Self-pay

## 2022-06-09 ENCOUNTER — Encounter: Payer: Self-pay | Admitting: Family Medicine

## 2022-06-09 ENCOUNTER — Other Ambulatory Visit (HOSPITAL_COMMUNITY)
Admission: RE | Admit: 2022-06-09 | Discharge: 2022-06-09 | Disposition: A | Discharge status: Home / Self Care | Payer: Medicaid Other | Source: Ambulatory Visit | Attending: Family Medicine | Admitting: Family Medicine

## 2022-06-09 VITALS — BP 105/77 | HR 82 | Ht 67.0 in | Wt 140.2 lb

## 2022-06-09 DIAGNOSIS — Z01419 Encounter for gynecological examination (general) (routine) without abnormal findings: Secondary | ICD-10-CM

## 2022-06-09 DIAGNOSIS — R87810 Cervical high risk human papillomavirus (HPV) DNA test positive: Secondary | ICD-10-CM | POA: Diagnosis not present

## 2022-06-09 DIAGNOSIS — R87610 Atypical squamous cells of undetermined significance on cytologic smear of cervix (ASC-US): Secondary | ICD-10-CM

## 2022-06-09 DIAGNOSIS — Z30013 Encounter for initial prescription of injectable contraceptive: Secondary | ICD-10-CM

## 2022-06-09 MED ORDER — MEDROXYPROGESTERONE ACETATE 150 MG/ML IM SUSP
150.0000 mg | Freq: Once | INTRAMUSCULAR | Status: AC
  Administered 2022-06-09: 150 mg via INTRAMUSCULAR

## 2022-06-09 NOTE — Progress Notes (Signed)
   GYNECOLOGY ANNUAL PREVENTATIVE CARE ENCOUNTER NOTE  Subjective:   Kendra Barajas is a 22 y.o. G72P1001 female here for a routine annual gynecologic exam.  Current complaints: none, feeling well today. Using depo for pregnancy prevention. 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   Denies abnormal vaginal bleeding, discharge, pelvic pain, problems with intercourse or other gynecologic concerns.    Gynecologic History Patient's last menstrual period was 06/01/2022 (exact date). Contraception: Depo-Provera injections Last Pap: 2023. Results were: abnormal Last mammogram: NA.   Health Maintenance Due  Topic Date Due   INFLUENZA VACCINE  10/27/2021    The following portions of the patient's history were reviewed and updated as appropriate: allergies, current medications, past family history, past medical history, past social history, past surgical history and problem list.  Review of Systems Pertinent items are noted in HPI.   Objective:  BP 105/77   Pulse 82   Ht 5\' 7"  (1.702 m)   Wt 140 lb 3.2 oz (63.6 kg)   LMP 06/01/2022 (Exact Date)   Breastfeeding Yes   BMI 21.96 kg/m  CONSTITUTIONAL: Well-developed, well-nourished female in no acute distress.  HENT:  Normocephalic, atraumatic, External right and left ear normal. Oropharynx is clear and moist EYES:  No scleral icterus.  NECK: Normal range of motion, supple, no masses.  Normal thyroid.  SKIN: Skin is warm and dry. No rash noted. Not diaphoretic. No erythema. No pallor. NEUROLOGIC: Alert and oriented to person, place, and time. Normal reflexes, muscle tone coordination. No cranial nerve deficit noted. PSYCHIATRIC: Normal mood and affect. Normal behavior. Normal judgment and thought content. CARDIOVASCULAR: Normal heart rate noted, regular rhythm. 2+ distal pulses. RESPIRATORY: Effort and breath sounds normal, no problems with respiration noted. BREASTS: Symmetric in size. No masses, skin changes, nipple  drainage, or lymphadenopathy. ABDOMEN: Soft,  no distention noted.  No tenderness, rebound or guarding.  PELVIC: Normal appearing external genitalia; normal appearing vaginal mucosa and cervix.  No abnormal discharge noted.  Pap smear obtained.  Normal uterine size, no other palpable masses, no uterine or adnexal tenderness. Chaperone present for exam MUSCULOSKELETAL: Normal range of motion.    Assessment and Plan:  1) Annual gynecologic examination with pap smear:  Will follow up results of pap smear and manage accordingly. STI screen also ordered today.  Routine preventative health maintenance measures emphasized.  2) Contraception counseling:   patient centered counseling and patient desired depo.  Emphasized use of condoms 100% of the time for STI prevention.  1. Well woman exam with routine gynecological exam - medroxyPROGESTERone (DEPO-PROVERA) injection 150 mg - Cytology - PAP( [facility])  2. ASCUS with positive high risk HPV cervical    Please refer to After Visit Summary for other counseling recommendations.   No follow-ups on file.  Caren Macadam, MD, MPH, ABFM Attending Physician Center for [facility]

## 2022-06-09 NOTE — Assessment & Plan Note (Signed)
Repeat pap today.  

## 2022-06-16 LAB — CYTOLOGY - PAP
Chlamydia: NEGATIVE
Comment: NEGATIVE
Comment: NEGATIVE
Comment: NORMAL
Diagnosis: NEGATIVE
Neisseria Gonorrhea: NEGATIVE
Trichomonas: NEGATIVE

## 2022-06-17 ENCOUNTER — Telehealth: Payer: Self-pay | Admitting: *Deleted

## 2022-06-17 NOTE — Telephone Encounter (Signed)
-----   Message from Caren Macadam, MD sent at 06/16/2022  1:39 PM EDT ----- NIL, next in 3 years

## 2022-06-17 NOTE — Telephone Encounter (Signed)
I called Kendra Barajas and reviewed her pap smear results and recommendations per Dr. Ernestina Patches and reviewed message Dr. Ernestina Patches had sent patient. She voices understanding and did not have any questions. Staci Acosta

## 2022-06-29 ENCOUNTER — Encounter: Payer: Self-pay | Admitting: Family Medicine

## 2022-07-05 ENCOUNTER — Encounter: Payer: Self-pay | Admitting: Family Medicine

## 2022-07-20 ENCOUNTER — Ambulatory Visit: Payer: Medicaid Other | Admitting: Obstetrics and Gynecology

## 2022-09-09 ENCOUNTER — Encounter: Payer: Self-pay | Admitting: *Deleted

## 2022-09-09 ENCOUNTER — Ambulatory Visit (INDEPENDENT_AMBULATORY_CARE_PROVIDER_SITE_OTHER): Payer: Medicaid Other | Admitting: *Deleted

## 2022-09-09 VITALS — BP 123/82 | HR 98 | Ht 67.0 in | Wt 140.0 lb

## 2022-09-09 DIAGNOSIS — Z3042 Encounter for surveillance of injectable contraceptive: Secondary | ICD-10-CM | POA: Diagnosis not present

## 2022-09-09 MED ORDER — MEDROXYPROGESTERONE ACETATE 150 MG/ML IM SUSP
150.0000 mg | Freq: Once | INTRAMUSCULAR | Status: AC
  Administered 2022-09-09: 150 mg via INTRAMUSCULAR

## 2022-09-09 NOTE — Progress Notes (Signed)
Depo Provera 150 mg IM administered as scheduled. Pt tolerated well. Next injection due 8/29-9/12. Last pap done 06/09/22 - normal. Next Annual gyn exam due after 06/09/23.

## 2022-12-06 ENCOUNTER — Ambulatory Visit: Payer: Medicaid Other

## 2022-12-08 ENCOUNTER — Encounter: Payer: Self-pay | Admitting: Family Medicine

## 2022-12-08 ENCOUNTER — Ambulatory Visit (INDEPENDENT_AMBULATORY_CARE_PROVIDER_SITE_OTHER): Payer: Medicaid Other | Admitting: Family Medicine

## 2022-12-08 ENCOUNTER — Ambulatory Visit (INDEPENDENT_AMBULATORY_CARE_PROVIDER_SITE_OTHER): Payer: Medicaid Other

## 2022-12-08 VITALS — Ht 67.0 in | Wt 142.0 lb

## 2022-12-08 DIAGNOSIS — N644 Mastodynia: Secondary | ICD-10-CM

## 2022-12-08 DIAGNOSIS — Z23 Encounter for immunization: Secondary | ICD-10-CM | POA: Diagnosis not present

## 2022-12-08 DIAGNOSIS — Z3042 Encounter for surveillance of injectable contraceptive: Secondary | ICD-10-CM

## 2022-12-08 DIAGNOSIS — Z01419 Encounter for gynecological examination (general) (routine) without abnormal findings: Secondary | ICD-10-CM

## 2022-12-08 MED ORDER — MEDROXYPROGESTERONE ACETATE 150 MG/ML IM SUSP
150.0000 mg | Freq: Once | INTRAMUSCULAR | Status: AC
  Administered 2022-12-08: 150 mg via INTRAMUSCULAR

## 2022-12-08 MED ORDER — CEPHALEXIN 500 MG PO CAPS: 500.0000 mg | ORAL_CAPSULE | Freq: Three times a day (TID) | ORAL | 0 refills | Status: DC

## 2022-12-08 NOTE — Progress Notes (Signed)
   GYNECOLOGY PROBLEM  VISIT ENCOUNTER NOTE  Subjective:   Kendra Barajas is a 22 y.o. G10P1001 female here for a problem GYN visit.  Current complaints: Right breast pain, started today. TTP on the right.   Denies abnormal vaginal bleeding, discharge, pelvic pain, problems with intercourse or other gynecologic concerns.    Gynecologic History No LMP recorded (lmp unknown). Patient has had an injection.  Contraception: Depo-Provera injections  There are no preventive care reminders to display for this patient.  The following portions of the patient's history were reviewed and updated as appropriate: allergies, current medications, past family history, past medical history, past social history, past surgical history and problem list.  Review of Systems Pertinent items are noted in HPI.   Objective:  LMP  (LMP Unknown)  Gen: well appearing, NAD HEENT: no scleral icterus CV: RR Lung: Normal WOB Ext: warm well perfused  Breast R: + erythema at 11 oclock with TTP in that quadrant.  L WNL   Assessment and Plan:   1. Breast pain, right- Right mastitis Rx for Keflex sent to pharmacy   Please refer to After Visit Summary for other counseling recommendations.   Return in about 3 months (around 03/09/2023) for Depo.  Federico Flake, MD, MPH, ABFM Attending Physician Faculty Practice- Center for [facility]

## 2022-12-08 NOTE — Progress Notes (Signed)
Izna Danish here for Depo-Provera Injection. Injection administered without complication. Patient will return in 3 months for next injection between Nov 27  and Mar 09, 2023. Next annual visit due March 2025.   Pt also states having painful right breast since 5am this morning. Pt would like Dr Alvester Morin to assess. Pt is still breastfeeding.  Isabell Jarvis, RN 12/08/2022  12:16 PM

## 2022-12-08 NOTE — Addendum Note (Signed)
Addended by: Geanie Berlin on: 12/08/2022 05:22 PM   Modules accepted: Orders, Level of Service

## 2022-12-08 NOTE — Progress Notes (Signed)
   GYNECOLOGY PROBLEM  VISIT ENCOUNTER NOTE  Subjective:   Kendra Barajas is a 22 y.o. G21P1001 female here for a problem GYN visit.  Current complaints: breast pain.   Denies abnormal vaginal bleeding, discharge, pelvic pain, problems with intercourse or other gynecologic concerns.    Gynecologic History No LMP recorded (lmp unknown). Patient has had an injection.  Contraception: Depo-Provera injections  There are no preventive care reminders to display for this patient.  The following portions of the patient's history were reviewed and updated as appropriate: allergies, current medications, past family history, past medical history, past social history, past surgical history and problem list.  Review of Systems Pertinent items are noted in HPI.   Objective:  Ht 5\' 7"  (1.702 m)   Wt 142 lb (64.4 kg)   LMP  (LMP Unknown)   Breastfeeding Yes   BMI 22.24 kg/m  Gen: well appearing, NAD HEENT: no scleral icterus CV: RR Lung: Normal WOB Ext: warm well perfused  Breast: Right breast with warmth and erythema at 11 oclock.   Assessment and Plan:  1. 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  - Flu vaccine trivalent PF, 6mos and older(Flulaval,Afluria,Fluarix,Fluzone)  2. Breast pain, right Treating like mastitis, acute onset and area is erythematous. + clogged duct at 11 oclock near [place].  - cephALEXin (KEFLEX) 500 MG capsule; Take 1 capsule (500 mg total) by mouth 3 (three) times daily.  Dispense: 21 capsule; Refill: 0   Please refer to After Visit Summary for other counseling recommendations.   No follow-ups on file.  Federico Flake, MD, MPH, ABFM Attending Physician Faculty Practice- Center for [facility]

## 2023-01-17 ENCOUNTER — Ambulatory Visit: Payer: Medicaid Other | Admitting: Podiatry

## 2023-01-26 ENCOUNTER — Other Ambulatory Visit: Payer: Self-pay | Admitting: Family Medicine

## 2023-02-02 ENCOUNTER — Ambulatory Visit (INDEPENDENT_AMBULATORY_CARE_PROVIDER_SITE_OTHER): Payer: Medicaid Other | Admitting: Podiatry

## 2023-02-02 ENCOUNTER — Encounter: Payer: Self-pay | Admitting: Podiatry

## 2023-02-02 ENCOUNTER — Ambulatory Visit (INDEPENDENT_AMBULATORY_CARE_PROVIDER_SITE_OTHER): Payer: Medicaid Other

## 2023-02-02 DIAGNOSIS — M2041 Other hammer toe(s) (acquired), right foot: Secondary | ICD-10-CM

## 2023-02-02 DIAGNOSIS — M778 Other enthesopathies, not elsewhere classified: Secondary | ICD-10-CM

## 2023-02-02 DIAGNOSIS — M7751 Other enthesopathy of right foot: Secondary | ICD-10-CM

## 2023-02-02 NOTE — Progress Notes (Signed)
   Chief Complaint  Patient presents with   Foot Pain    Right foot pain started a few months ago no injury just hurting really bad.    HPI: 22 y.o. female presenting today for evaluation of pain and tenderness associated to the right fourth toe.  Patient has history of hammertoe repair right foot on 06/30/2020.  Patient has subsequently developed pain and tenderness associated to the right fourth toe.  She says that the right fourth toe is crooked.  All of the toes are asymptomatic.  Especially painful when on her feet for prolonged periods of time.  Past Medical History:  Diagnosis Date   Anxiety    Asthma    Depression    Gestational hypertension 06/04/2021    Past Surgical History:  Procedure Laterality Date   Contracted toe Right 08/2019   FOOT SURGERY     WISDOM TOOTH EXTRACTION      No Known Allergies   Physical Exam: General: The patient is alert and oriented x3 in no acute distress.  Dermatology: Skin is warm, dry and supple bilateral lower extremities.   Vascular: Palpable pedal pulses bilaterally. Capillary refill within normal limits.  No appreciable edema.  No erythema.  Neurological: Grossly intact via light touch  Musculoskeletal Exam: All toes are in rectus alignment with exception of the right fourth toe which does have some adductovarus curvature.  Associated pain with palpation as well  Radiographic Exam RT foot 02/02/2023:  Normal osseous mineralization.  Prior hammertoe surgery to the lesser digits which are all in rectus alignment with exception of adductovarus curvature noted to the fourth digit of the right foot  Assessment/Plan of Care: 1.  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  X-rays reviewed -The patient has tried multiple conservative modalities but there does not appear to be any improvement.  She has tried different shoes but she says that all shoes seem to hurt her foot. -I did offer revisional  hammertoe surgery to the right fourth toe but she is hesitant to take time off of work.  This was discussed in detail with the patient.  The procedure and postoperative protocol were explained.  She would like to go home and think about it -Return to clinic as needed       Felecia Shelling, DPM [facility]  Dr. Felecia Shelling, DPM    [address] [place], [place] [address]                Office [phone]  Fax [phone]

## 2023-02-28 ENCOUNTER — Encounter: Payer: Self-pay | Admitting: Family Medicine

## 2023-03-03 ENCOUNTER — Ambulatory Visit: Payer: Medicaid Other

## 2023-03-07 ENCOUNTER — Encounter: Payer: Self-pay | Admitting: Family Medicine

## 2023-03-07 DIAGNOSIS — B372 Candidiasis of skin and nail: Secondary | ICD-10-CM

## 2023-03-08 ENCOUNTER — Ambulatory Visit: Payer: Medicaid Other

## 2023-03-08 MED ORDER — NYSTATIN 100000 UNIT/GM EX CREA: TOPICAL_CREAM | CUTANEOUS | 1 refills | Status: DC

## 2023-03-14 ENCOUNTER — Ambulatory Visit (INDEPENDENT_AMBULATORY_CARE_PROVIDER_SITE_OTHER): Payer: Medicaid Other

## 2023-03-14 ENCOUNTER — Other Ambulatory Visit: Payer: Self-pay

## 2023-03-14 VITALS — BP 119/67 | HR 74 | Ht 67.0 in | Wt 144.8 lb

## 2023-03-14 DIAGNOSIS — Z3042 Encounter for surveillance of injectable contraceptive: Secondary | ICD-10-CM | POA: Diagnosis not present

## 2023-03-14 DIAGNOSIS — Z3202 Encounter for pregnancy test, result negative: Secondary | ICD-10-CM

## 2023-03-14 LAB — POCT PREGNANCY, URINE: Preg Test, Ur: NEGATIVE

## 2023-03-14 MED ORDER — MEDROXYPROGESTERONE ACETATE 150 MG/ML IM SUSY
150.0000 mg | PREFILLED_SYRINGE | Freq: Once | INTRAMUSCULAR | Status: AC
  Administered 2023-03-14: 150 mg via INTRAMUSCULAR

## 2023-03-14 NOTE — Progress Notes (Signed)
Kendra Barajas here for Depo-Provera Injection. Patient received last injection on 12/08/22; patient is [redacted]w[redacted]d since last injection-per protocol, negative UPT needed in order to administer next injection today. UPT performed in office--negative; able to proceed with next injection today. Injection administered without complication. Patient will return in 3 months for next injection between 05/30/23 and 06/13/23. Next annual visit due 06/09/23.   Meryl Crutch, RN 03/14/2023  10:39 AM

## 2023-04-04 ENCOUNTER — Encounter: Payer: Self-pay | Admitting: Obstetrics and Gynecology

## 2023-04-04 ENCOUNTER — Other Ambulatory Visit: Payer: Self-pay

## 2023-04-04 ENCOUNTER — Other Ambulatory Visit (HOSPITAL_COMMUNITY)
Admission: RE | Admit: 2023-04-04 | Discharge: 2023-04-04 | Disposition: A | Discharge status: Home / Self Care | Payer: Medicaid Other | Source: Ambulatory Visit | Attending: Medical | Admitting: Medical

## 2023-04-04 ENCOUNTER — Ambulatory Visit (INDEPENDENT_AMBULATORY_CARE_PROVIDER_SITE_OTHER): Payer: Medicaid Other | Admitting: Obstetrics and Gynecology

## 2023-04-04 VITALS — BP 118/67 | HR 79 | Ht 67.0 in | Wt 148.2 lb

## 2023-04-04 DIAGNOSIS — R102 Pelvic and perineal pain: Secondary | ICD-10-CM | POA: Diagnosis present

## 2023-04-04 NOTE — Progress Notes (Signed)
 Pelvic US scheduled at [facility] on 04/18/23 at 1400.   Marcelino Duster, RN

## 2023-04-04 NOTE — Progress Notes (Signed)
    CC: abdominal pain Subjective:    Patient ID: Kendra Barajas, female    DOB: [birth year], 23 y.o.   MRN: [id number]  HPI  23 yo G1P1, SVD x 1, seen for discussion of abdominal/ pelvic pain.  Pt has 1 year of pelvic cramping.  The cramping is intermittent and she cannot think of any precipitating factors.  Pt denies dysuria, nausea/vomiting, dyspareunia, or constipation/diarrhea.  Pain was last noted 1-2 days ago.  After the birth of the first child, pt noted similar discomfort with breast feeding.  The patient has tried no interventions to address the pain. Pt is mostly amenorrheic due to depo provera  use.  Review of Systems     Objective:   Physical Exam Abdominal:     General: There is no distension.     Palpations: There is no mass.     Tenderness: There is no guarding or rebound.     Comments: Mild LLQ tenderness  Genitourinary:    Comments: SVE: no CMT, no adnexal masses detected Mild left adnexal discomfort. No uterine tenderness   Vitals:   04/04/23 1340  BP: 118/67  Pulse: 79         Assessment & Plan:   1. Pelvic cramping (Primary) Unsure if intermittent discomfort has gyn etiology Advised NSAIDs for initial treatment Pt desired further evaluation so will check vaginal ultrasound. F/u in 6 weeks with virtual visit  - US  PELVIC COMPLETE WITH TRANSVAGINAL; Future - Cervicovaginal ancillary only  I spent 20 minutes dedicated to the care of this patient including previsit review of records, face to face time with the patient discussing symptoms, etiology and further evaluation options along with post visit testing.   Jerilynn DELENA Buddle, MD Faculty Attending, Center for [facility]

## 2023-04-06 LAB — CERVICOVAGINAL ANCILLARY ONLY
Bacterial Vaginitis (gardnerella): NEGATIVE
Candida Glabrata: NEGATIVE
Candida Vaginitis: NEGATIVE
Comment: NEGATIVE
Comment: NEGATIVE
Comment: NEGATIVE
Comment: NEGATIVE
Trichomonas: NEGATIVE

## 2023-04-18 ENCOUNTER — Ambulatory Visit (HOSPITAL_COMMUNITY): Payer: Medicaid Other

## 2023-04-22 ENCOUNTER — Ambulatory Visit (HOSPITAL_COMMUNITY)
Admission: RE | Admit: 2023-04-22 | Discharge: 2023-04-22 | Disposition: A | Discharge status: Home / Self Care | Payer: Medicaid Other | Source: Ambulatory Visit | Attending: Obstetrics and Gynecology

## 2023-04-22 DIAGNOSIS — R102 Pelvic and perineal pain: Secondary | ICD-10-CM | POA: Insufficient documentation

## 2023-04-26 ENCOUNTER — Encounter: Payer: Self-pay | Admitting: Obstetrics and Gynecology

## 2023-05-13 ENCOUNTER — Emergency Department (HOSPITAL_BASED_OUTPATIENT_CLINIC_OR_DEPARTMENT_OTHER): Payer: Medicaid Other | Admitting: Radiology

## 2023-05-13 ENCOUNTER — Encounter (HOSPITAL_BASED_OUTPATIENT_CLINIC_OR_DEPARTMENT_OTHER): Payer: Self-pay

## 2023-05-13 ENCOUNTER — Emergency Department (HOSPITAL_BASED_OUTPATIENT_CLINIC_OR_DEPARTMENT_OTHER)
Admission: EM | Admit: 2023-05-13 | Discharge: 2023-05-14 | Disposition: A | Discharge status: Home / Self Care | Payer: Medicaid Other | Attending: Emergency Medicine | Admitting: Emergency Medicine

## 2023-05-13 DIAGNOSIS — Z20822 Contact with and (suspected) exposure to covid-19: Secondary | ICD-10-CM | POA: Insufficient documentation

## 2023-05-13 DIAGNOSIS — J4520 Mild intermittent asthma, uncomplicated: Secondary | ICD-10-CM | POA: Diagnosis not present

## 2023-05-13 DIAGNOSIS — J069 Acute upper respiratory infection, unspecified: Secondary | ICD-10-CM | POA: Diagnosis not present

## 2023-05-13 DIAGNOSIS — R059 Cough, unspecified: Secondary | ICD-10-CM | POA: Diagnosis present

## 2023-05-13 LAB — RESP PANEL BY RT-PCR (RSV, FLU A&B, COVID)  RVPGX2
Influenza A by PCR: NEGATIVE
Influenza B by PCR: NEGATIVE
Resp Syncytial Virus by PCR: NEGATIVE
SARS Coronavirus 2 by RT PCR: NEGATIVE

## 2023-05-13 MED ORDER — PREDNISONE 50 MG PO TABS
60.0000 mg | ORAL_TABLET | Freq: Once | ORAL | Status: AC
  Administered 2023-05-13: 60 mg via ORAL
  Filled 2023-05-13: qty 1

## 2023-05-13 MED ORDER — BENZONATATE 100 MG PO CAPS: 100.0000 mg | ORAL_CAPSULE | Freq: Three times a day (TID) | ORAL | 0 refills | Status: AC

## 2023-05-13 MED ORDER — ALBUTEROL SULFATE HFA 108 (90 BASE) MCG/ACT IN AERS: 1.0000 | INHALATION_SPRAY | Freq: Four times a day (QID) | RESPIRATORY_TRACT | 0 refills | Status: AC | PRN

## 2023-05-13 MED ORDER — IPRATROPIUM-ALBUTEROL 0.5-2.5 (3) MG/3ML IN SOLN
3.0000 mL | Freq: Once | RESPIRATORY_TRACT | Status: AC
  Administered 2023-05-13: 3 mL via RESPIRATORY_TRACT
  Filled 2023-05-13: qty 3

## 2023-05-13 MED ORDER — METHYLPREDNISOLONE 4 MG PO TBPK: ORAL_TABLET | ORAL | 0 refills | Status: DC

## 2023-05-13 NOTE — Discharge Instructions (Addendum)
Your work-up in the ER today was reassuring for acute findings. You were swabbed for COVID, flu, and RSV which were negative.  Your chest x-ray was also clear for any signs of pneumonia.  However, your symptoms are still likely related to an upper respiratory infection likely made worse by your asthma. As these are almost always viral in nature, no antibiotics are indicated. I recommend that you get plenty of rest and focus on symptomatic relief which includes Cepacol throat lozenges for sore throat, Mucinex for congestion, and tylenol/ibuprofen as needed for fevers and bodyaches. I have also given you a prescription for a Medrol Dosepak, Tessalon, and albuterol for management of your cough and asthma.  Please take the Medrol Dosepak as prescribed in its entirety, the albuterol and Tessalon as needed.  I also recommend:  Increased fluid intake. Sports drinks offer valuable electrolytes, sugars, and fluids.  Breathing heated mist or steam (vaporizer or shower).  Eating chicken soup or other clear broths, and maintaining good nutrition.   Increasing usage of your inhaler if you have asthma.  Return to work when your temperature has returned to normal.  Gargle warm salt water and spit it out for sore throat. Take benadryl or Zyrtec to decrease sinus secretions.  Follow Up: Follow up with your primary care doctor in 5-7 days for recheck of ongoing symptoms.  Return to emergency department for emergent changing or worsening of symptoms.

## 2023-05-13 NOTE — ED Provider Notes (Signed)
[name] [facility] AT [facility] Provider Note   CSN: [id number] Arrival date & time: 05/13/23  1955     History  Chief Complaint  Patient presents with   Cough    Kendra Barajas is a 23 y.o. female.  Patient with history of asthma presents today with complaints of cough and congestion.  She states that her symptoms have been ongoing for the last week.  Denies any fevers or chills.  No sick contacts.  She states that she feels like her cough is causing her asthma to flare and she unfortunately ran out of her albuterol a few weeks ago and has been unable to get a refill.  She denies any chest pain or shortness of breath.  No leg pain or leg swelling.  Does endorse a cough productive of green sputum.  The history is provided by the patient. No language interpreter was used.  Cough      Home Medications Prior to Admission medications   Not on File      Allergies    Patient has no known allergies.    Review of Systems   Review of Systems  Respiratory:  Positive for cough.   All other systems reviewed and are negative.   Physical Exam Updated Vital Signs BP 127/79 (BP Location: Left Arm)   Pulse 90   Temp 98 F (36.7 C)   Resp 20   SpO2 100%   Breastfeeding No  Physical Exam Vitals and nursing note reviewed.  Constitutional:      General: She is not in acute distress.    Appearance: Normal appearance. She is normal weight. She is not ill-appearing, toxic-appearing or diaphoretic.  HENT:     Head: Normocephalic and atraumatic.  Cardiovascular:     Rate and Rhythm: Normal rate.  Pulmonary:     Effort: Pulmonary effort is normal. No respiratory distress.     Comments: Mild expiratory wheezing present at the lung bases bilaterally. Musculoskeletal:        General: Normal range of motion.     Cervical back: Normal range of motion.  Skin:    General: Skin is warm and dry.  Neurological:     General: No focal deficit present.     Mental  Status: She is alert.  Psychiatric:        Mood and Affect: Mood normal.        Behavior: Behavior normal.     ED Results / Procedures / Treatments   Labs (all labs ordered are listed, but only abnormal results are displayed) Labs Reviewed  RESP PANEL BY RT-PCR (RSV, FLU A&B, COVID)  RVPGX2    EKG None  Radiology DG Chest 2 View Result Date: 05/13/2023 CLINICAL DATA:  Cough. EXAM: CHEST - 2 VIEW COMPARISON:  02/16/2013 FINDINGS: The cardiomediastinal contours are normal. The lungs are clear. Pulmonary vasculature is normal. No consolidation, pleural effusion, or pneumothorax. No acute osseous abnormalities are seen. IMPRESSION: No active cardiopulmonary disease. Electronically Signed   By: Narda Rutherford M.D.   On: 05/13/2023 22:52    Procedures Procedures    Medications Ordered in ED Medications  ipratropium-albuterol (DUONEB) 0.5-2.5 (3) MG/3ML nebulizer solution 3 mL (has no administration in time range)  predniSONE (DELTASONE) tablet 60 mg (has no administration in time range)    ED Course/ Medical Decision Making/ A&P  Medical Decision Making Amount and/or Complexity of Data Reviewed Radiology: ordered.  Risk Prescription drug management.   Patient presents today with complaints of cough and congestion x 7 days.  They are afebrile, nontoxic-appearing, and in no acute distress with reassuring vital signs.  Physical exam reveals mild expiratory wheezes in the bilateral lung bases.  Given duration of symptoms, chest x-ray ordered and obtained which has resulted and reveals no acute findings.  I personally reviewed and interpreted this imaging and agree with radiology interpretation. Patient negative for COVID, flu, and RSV.  However, patient's symptoms likely due to URI, likely viral etiology.  Discussed with patient that there is no indication for antibiotics for viral infections.  Her symptoms are likely worsened due to her asthma,  given a DuoNeb treatment and oral prednisone in the ER today with significant improvement.  Will refill her albuterol inhaler prescription and sent for a Medrol Dosepak as well.  Will also send for Tessalon for cough suppression and recommend close outpatient follow-up with return precautions. Evaluation and diagnostic testing in the emergency department does not suggest an emergent condition requiring admission or immediate intervention beyond what has been performed at this time.  Plan for discharge with close PCP follow-up.  Patient is understanding and amenable with plan, educated on red flag symptoms that would prompt immediate return.  Patient discharged in stable condition.  Final Clinical Impression(s) / ED Diagnoses Final diagnoses:  Viral upper respiratory tract infection  Mild intermittent asthma without complication    Rx / DC Orders ED Discharge Orders          Ordered    methylPREDNISolone (MEDROL DOSEPAK) 4 MG TBPK tablet        05/13/23 2327    albuterol (VENTOLIN HFA) 108 (90 Base) MCG/ACT inhaler  Every 6 hours PRN        05/13/23 2327    benzonatate (TESSALON) 100 MG capsule  Every 8 hours        05/13/23 2327          An After Visit Summary was printed and given to the patient.     Vear Clock 05/13/23 2336    Glyn Ade, MD 05/14/23 1511

## 2023-05-13 NOTE — ED Triage Notes (Signed)
Pt c/o "really bad" productive cough x1wk, states that "every time I have to cough it makes me [facility], have to use my inhaler, it's given me a sore throat."

## 2023-05-23 ENCOUNTER — Telehealth: Payer: Medicaid Other | Admitting: Obstetrics and Gynecology

## 2023-05-23 DIAGNOSIS — R1030 Lower abdominal pain, unspecified: Secondary | ICD-10-CM | POA: Diagnosis not present

## 2023-05-23 NOTE — Progress Notes (Signed)
 GYNECOLOGY VIRTUAL VISIT ENCOUNTER NOTE  Provider location: Center for [facility]   Patient location: Home  I connected with Kendra Barajas on 05/23/23 at  9:35 AM EST by MyChart Video Encounter and verified that I am speaking with the correct person using two identifiers.   I discussed the limitations, risks, security and privacy concerns of performing an evaluation and management service virtually and the availability of in person appointments. I also discussed with the patient that there may be a patient responsible charge related to this service. The patient expressed understanding and agreed to proceed.   History:  Kendra Barajas is a 23 y.o. G63P1001 female being evaluated today for follow up of abdominal pain and ultrasound follow up. She denies any abnormal vaginal discharge, bleeding, or other concerns.    Pt has had pelvic ultrasound which was overall normal.  Pt only took tylenol for discomfort and did not really take any NSAIDs.  Pt still notes intermittent cramping.  Pt continues with depo provera and is amenorrheic.  She is not sexually active.     Past Medical History:  Diagnosis Date   Anxiety    Asthma    Depression    Gestational hypertension 06/04/2021   Past Surgical History:  Procedure Laterality Date   Contracted toe Right 08/2019   FOOT SURGERY     WISDOM TOOTH EXTRACTION     The following portions of the patient's history were reviewed and updated as appropriate: allergies, current medications, past family history, past medical history, past social history, past surgical history and problem list.   Health Maintenance:  Normal pap and negative HRHPV on 06/09/22.    Review of Systems:  Pertinent items noted in HPI and remainder of comprehensive ROS otherwise negative.  Physical Exam:   General:  Alert, oriented and cooperative. Patient appears to be in no acute distress.  Mental Status: Normal mood and affect. Normal  behavior. Normal judgment and thought content.   Respiratory: Normal respiratory effort, no problems with respiration noted  Rest of physical exam deferred due to type of encounter  Labs and Imaging Results for orders placed or performed during the hospital encounter of 05/13/23 (from the past 2 weeks)  Resp panel by RT-PCR (RSV, Flu A&B, Covid) Anterior Nasal Swab   Collection Time: 05/13/23  8:05 PM   Specimen: Anterior Nasal Swab  Result Value Ref Range   SARS Coronavirus 2 by RT PCR NEGATIVE NEGATIVE   Influenza A by PCR NEGATIVE NEGATIVE   Influenza B by PCR NEGATIVE NEGATIVE   Resp Syncytial Virus by PCR NEGATIVE NEGATIVE   DG Chest 2 View Result Date: 05/13/2023 CLINICAL DATA:  Cough. EXAM: CHEST - 2 VIEW COMPARISON:  02/16/2013 FINDINGS: The cardiomediastinal contours are normal. The lungs are clear. Pulmonary vasculature is normal. No consolidation, pleural effusion, or pneumothorax. No acute osseous abnormalities are seen. IMPRESSION: No active cardiopulmonary disease. Electronically Signed   By: Narda Rutherford M.D.   On: 05/13/2023 22:52       PROCEDURE: US PELVIS COMPLETE WITH TRANSVAGINAL   HISTORY: Patient is a 23 y/o F with one year of pelvic pain, mostly left sided cramping. LMP unsure of dates.   COMPARISON: CT AP 06/02/2022.   TECHNIQUE: Two-dimensional transabdominal grayscale and color Doppler ultrasound of the pelvis was performed. Transvaginal was also performed.   FINDINGS: The uterus is anteverted in position and measures 7.3 x 5.7 x 2.5 cm. It demonstrates a normal, homogeneous echotexture. The endometrium  measures 0.3 cm and demonstrates a normal homogeneous echotexture. A small amount of fluid is noted within the cervix.   The right ovary measures 3.7 x 1.9 x 1.6 cm and demonstrates a normal echotexture. There is normal color Doppler flow.   The left ovary measures 1.8 x 1.4 x 1.3 cm and demonstrates a normal echotexture. There is normal color  Doppler flow.   There is no fluid present within the cul-de-sac.   IMPRESSION: 1. Small amount of fluid within the cervix, otherwise unremarkable ultrasound of the pelvis.   Thank you for allowing [place] to assist in the care of this patient.    Assessment and Plan:     1. Lower abdominal pain (Primary) Symptoms tend to not support gyn etiology for pain. Pelvic ultrasound was largely benign. Will refer to GI for evaluation.  If their workup is negative a diagnostic laparoscopy can be ordered for further evaluation if needed.       I discussed the assessment and treatment plan with the patient. The patient was provided an opportunity to ask questions and all were answered. The patient agreed with the plan and demonstrated an understanding of the instructions.   The patient was advised to call back or seek an in-person evaluation/go to the ED if the symptoms worsen or if the condition fails to improve as anticipated.  I provided 10 minutes of face-to-face time during this encounter. I also spent 5 minutes dedicated to the care of this patient including pre-visit review of records, post visit ordering of medications and appropriate tests or procedures, coordinating care and documenting this visit encounter.    [name], MD Center for [employer], [facility]

## 2023-05-30 ENCOUNTER — Encounter: Payer: Self-pay | Admitting: Family Medicine

## 2023-05-30 ENCOUNTER — Ambulatory Visit: Payer: Medicaid Other

## 2023-06-02 ENCOUNTER — Other Ambulatory Visit: Payer: Self-pay

## 2023-06-02 ENCOUNTER — Ambulatory Visit (INDEPENDENT_AMBULATORY_CARE_PROVIDER_SITE_OTHER): Admitting: *Deleted

## 2023-06-02 VITALS — BP 131/77 | HR 73 | Ht 67.0 in | Wt 150.5 lb

## 2023-06-02 DIAGNOSIS — Z3042 Encounter for surveillance of injectable contraceptive: Secondary | ICD-10-CM

## 2023-06-02 MED ORDER — MEDROXYPROGESTERONE ACETATE 150 MG/ML IM SUSP
150.0000 mg | Freq: Once | INTRAMUSCULAR | Status: AC
  Administered 2023-06-02: 150 mg via INTRAMUSCULAR

## 2023-06-02 NOTE — Progress Notes (Signed)
 Depo Provera 150mg  IM administered as scheduled. Next dose due 5/22-6/5.  Next Annual gyn exam due after 06/09/23. Per chart review, pt has had several problem Gyn visits within the past year and therefore, annual exam may be deferred by provider. Pt is due to have virtual visit in April with Dr. Donavan Foil for follow up from appt on 2/24 - needs to be scheduled.

## 2023-06-07 ENCOUNTER — Encounter: Payer: Self-pay | Admitting: Nurse Practitioner

## 2023-07-04 ENCOUNTER — Other Ambulatory Visit: Payer: Self-pay

## 2023-07-04 MED ORDER — PERMETHRIN 5 % EX CREA: 1.0000 | TOPICAL_CREAM | Freq: Once | CUTANEOUS | 1 refills | Status: AC

## 2023-07-06 ENCOUNTER — Telehealth: Admitting: Obstetrics and Gynecology

## 2023-07-26 ENCOUNTER — Ambulatory Visit: Admitting: Nurse Practitioner

## 2023-08-18 ENCOUNTER — Ambulatory Visit

## 2023-08-29 ENCOUNTER — Ambulatory Visit

## 2023-09-01 ENCOUNTER — Ambulatory Visit

## 2023-09-01 ENCOUNTER — Other Ambulatory Visit: Payer: Self-pay

## 2023-09-01 VITALS — BP 123/72 | HR 82 | Wt 158.9 lb

## 2023-09-01 DIAGNOSIS — Z3042 Encounter for surveillance of injectable contraceptive: Secondary | ICD-10-CM

## 2023-09-01 MED ORDER — MEDROXYPROGESTERONE ACETATE 150 MG/ML IM SUSY
150.0000 mg | PREFILLED_SYRINGE | Freq: Once | INTRAMUSCULAR | Status: AC
  Administered 2023-09-01: 150 mg via INTRAMUSCULAR

## 2023-09-01 NOTE — Progress Notes (Signed)
 Tai Doig here for Depo-Provera  Injection. Injection administered without complication. Patient will return in 3 months for next injection between 11/17/23  and 12/01/23. Next annual visit was due 06/09/23, pt agreeable to scheduling annual visit for ASAP.  Next pap smear due 06/08/25.  Pt verbalized understanding with no further question.  Ninette Basque, RN 09/01/2023  1:41 PM

## 2023-09-05 ENCOUNTER — Emergency Department (HOSPITAL_BASED_OUTPATIENT_CLINIC_OR_DEPARTMENT_OTHER)
Admission: EM | Admit: 2023-09-05 | Discharge: 2023-09-05 | Disposition: A | Discharge status: Home / Self Care | Attending: Emergency Medicine | Admitting: Emergency Medicine

## 2023-09-05 ENCOUNTER — Other Ambulatory Visit: Payer: Self-pay

## 2023-09-05 DIAGNOSIS — J029 Acute pharyngitis, unspecified: Secondary | ICD-10-CM | POA: Diagnosis present

## 2023-09-05 DIAGNOSIS — U071 COVID-19: Secondary | ICD-10-CM | POA: Insufficient documentation

## 2023-09-05 DIAGNOSIS — R197 Diarrhea, unspecified: Secondary | ICD-10-CM | POA: Diagnosis not present

## 2023-09-05 DIAGNOSIS — J45909 Unspecified asthma, uncomplicated: Secondary | ICD-10-CM | POA: Insufficient documentation

## 2023-09-05 LAB — GROUP A STREP BY PCR: Group A Strep by PCR: NOT DETECTED

## 2023-09-05 LAB — COMPREHENSIVE METABOLIC PANEL WITH GFR
ALT: 31 U/L (ref 0–44)
AST: 26 U/L (ref 15–41)
Albumin: 4.5 g/dL (ref 3.5–5.0)
Alkaline Phosphatase: 93 U/L (ref 38–126)
Anion gap: 14 (ref 5–15)
BUN: 8 mg/dL (ref 6–20)
CO2: 20 mmol/L — ABNORMAL LOW (ref 22–32)
Calcium: 9.6 mg/dL (ref 8.9–10.3)
Chloride: 103 mmol/L (ref 98–111)
Creatinine, Ser: 0.94 mg/dL (ref 0.44–1.00)
GFR, Estimated: >60 mL/min (ref >60)
Glucose, Bld: 105 mg/dL — ABNORMAL HIGH (ref 70–99)
Potassium: 3.4 mmol/L — ABNORMAL LOW (ref 3.5–5.1)
Sodium: 137 mmol/L (ref 135–145)
Total Bilirubin: 0.4 mg/dL (ref 0.0–1.2)
Total Protein: 7.9 g/dL (ref 6.5–8.1)

## 2023-09-05 LAB — URINALYSIS, ROUTINE W REFLEX MICROSCOPIC
Bacteria, UA: NONE SEEN
Bilirubin Urine: NEGATIVE
Glucose, UA: NEGATIVE mg/dL
Ketones, ur: NEGATIVE mg/dL
Nitrite: NEGATIVE
Protein, ur: NEGATIVE mg/dL
Specific Gravity, Urine: 1.012 (ref 1.005–1.030)
pH: 6 (ref 5.0–8.0)

## 2023-09-05 LAB — CBC
HCT: 36.8 % (ref 36.0–46.0)
Hemoglobin: 12.4 g/dL (ref 12.0–15.0)
MCH: 30 pg (ref 26.0–34.0)
MCHC: 33.7 g/dL (ref 30.0–36.0)
MCV: 88.9 fL (ref 80.0–100.0)
Platelets: 200 10*3/uL (ref 150–400)
RBC: 4.14 MIL/uL (ref 3.87–5.11)
RDW: 12 % (ref 11.5–15.5)
WBC: 4.1 10*3/uL (ref 4.0–10.5)
nRBC: 0 % (ref 0.0–0.2)

## 2023-09-05 LAB — PREGNANCY, URINE: Preg Test, Ur: NEGATIVE

## 2023-09-05 LAB — RESP PANEL BY RT-PCR (RSV, FLU A&B, COVID)  RVPGX2
Influenza A by PCR: NEGATIVE
Influenza B by PCR: NEGATIVE
Resp Syncytial Virus by PCR: NEGATIVE
SARS Coronavirus 2 by RT PCR: POSITIVE — AB

## 2023-09-05 LAB — LIPASE, BLOOD: Lipase: 17 U/L (ref 11–51)

## 2023-09-05 MED ORDER — ONDANSETRON HCL 4 MG/2ML IJ SOLN
4.0000 mg | Freq: Once | INTRAMUSCULAR | Status: AC
Start: 2023-09-05 — End: 2023-09-05
  Administered 2023-09-05: 4 mg via INTRAVENOUS
  Filled 2023-09-05: qty 2

## 2023-09-05 MED ORDER — ONDANSETRON 4 MG PO TBDP: 4.0000 mg | ORAL_TABLET | Freq: Three times a day (TID) | ORAL | 0 refills | Status: AC | PRN

## 2023-09-05 MED ORDER — PAXLOVID (300/100) 20 X 150 MG & 10 X 100MG PO TBPK: 3.0000 | ORAL_TABLET | Freq: Two times a day (BID) | ORAL | 0 refills | Status: AC

## 2023-09-05 MED ORDER — SODIUM CHLORIDE 0.9 % IV BOLUS
1000.0000 mL | Freq: Once | INTRAVENOUS | Status: AC
  Administered 2023-09-05: 1000 mL via INTRAVENOUS

## 2023-09-05 NOTE — ED Triage Notes (Signed)
 Pt POV reporting congestion, sinus pressure, sore throat, and nvd that began last night, unable to keep fluids or food down.

## 2023-09-05 NOTE — ED Provider Notes (Signed)
 [place] EM[facility]GENCY DEPARTMENT AT [facility] Provider Note   CSN: [id number] Arrival date & time: 09/05/23  1522     History {Add pertinent medical, surgical, social history, OB history to HPI:1} Chief Complaint  Patient presents with   Emesis   Diarrhea    Norell Brisbin is a 23 y.o. female.   Emesis Associated symptoms: diarrhea   Diarrhea Associated symptoms: vomiting   Patient presents with nausea vomiting diarrhea.  Started with sinus congestion pressure and sore throat.  Had had that for couple days and now nausea from diarrhea.  Decreased oral intake.  States feeling weak.  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    Past Medical History:  Diagnosis Date   Anxiety    Asthma    Depression    Gestational hypertension 06/04/2021    Home Medications Prior to Admission medications   Medication Sig Start Date End Date Taking? Authorizing Provider  albuterol  (VENTOLIN  HFA) 108 (90 Base) MCG/ACT inhaler Inhale 1-2 puffs into the lungs every 6 (six) hours as needed for wheezing or shortness of breath. Patient not taking: Reported on 09/01/2023 05/13/23   Smoot, Genevive Ket, PA-C  benzonatate  (TESSALON ) 100 MG capsule Take 1 capsule (100 mg total) by mouth every 8 (eight) hours. Patient not taking: Reported on 09/01/2023 05/13/23   Smoot, Genevive Ket, PA-C      Allergies    Patient has no known allergies.    Review of Systems   Review of Systems  Gastrointestinal:  Positive for diarrhea and vomiting.    Physical Exam Updated Vital Signs BP 120/82 (BP Location: Right Arm)   Pulse 94   Temp 99.5 F (37.5 C) (Oral)   Resp 17   Ht 5\' 7"  (1.702 m)   Wt 71.7 kg   SpO2 98%   BMI 24.75 kg/m  Physical Exam Vitals reviewed.  HENT:     Head: Normocephalic.  Cardiovascular:     Rate and Rhythm: Regular rhythm.  Pulmonary:     Breath sounds: No wheezing or rhonchi.  Abdominal:     Tenderness: There is no abdominal tenderness.  Skin:     Capillary Refill: Capillary refill takes less than 2 seconds.  Neurological:     Mental Status: She is alert.     ED Results / Procedures / Treatments   Labs (all labs ordered are listed, but only abnormal results are displayed) Labs Reviewed  RESP PANEL BY RT-PCR (RSV, FLU A&B, COVID)  RVPGX2  GROUP A STREP BY PCR  CBC  LIPASE, BLOOD  COMPREHENSIVE METABOLIC PANEL WITH GFR  URINALYSIS, ROUTINE W REFLEX MICROSCOPIC  PREGNANCY, URINE    EKG None  Radiology No results found.  Procedures Procedures  {Document cardiac monitor, telemetry assessment procedure when appropriate:1}  Medications Ordered in ED Medications  sodium chloride  0.9 % bolus 1,000 mL (has no administration in time range)  ondansetron  (ZOFRAN ) injection 4 mg (has no administration in time range)    ED Course/ Medical Decision Making/ A&P   {   Click here for ABCD2, HEART and other calculatorsREFRESH Note before signing :1}                              Medical Decision Making Amount and/or Complexity of Data Reviewed Labs: ordered.  Risk Prescription drug management.   Patient with URI symptoms with nausea vomiting diarrhea.  Differential diagnosis includes viral syndrome, gastroenteritis.  Pneumonia felt less likely.  Lungs clear.  Will get some basic blood work and give symptomatic treatment fluids and antiemetics.  Blood work reassuring but COVID showed up on nasal swab.  She is high risk with asthma.  Will offer Paxil bid.    {Document critical care time when appropriate:1} {Document review of labs and clinical decision tools ie heart score, Chads2Vasc2 etc:1}  {Document your independent review of radiology images, and any outside records:1} {Document your discussion with family members, caretakers, and with consultants:1} {Document social determinants of health affecting pt's care:1} {Document your decision making why or why not admission, treatments were needed:1} Final Clinical  Impression(s) / ED Diagnoses Final diagnoses:  None    Rx / DC Orders ED Discharge Orders     None

## 2023-09-05 NOTE — ED Notes (Signed)
 Pt given discharge instructions and reviewed prescriptions. Opportunities given for questions. Pt verbalizes understanding. PIV removed x1. Jillyn Hidden, RN

## 2023-09-20 ENCOUNTER — Ambulatory Visit: Admitting: Nurse Practitioner

## 2023-09-20 NOTE — Progress Notes (Deleted)
 09/20/2023 Essynce Munsch [id number] 2000-08-11   CHIEF COMPLAINT:   HISTORY OF PRESENT ILLNESS: Kendra Barajas is a 23 year old female with a past medical history of anxiety, depression, asthma and gestation diabetes. She presents to our office today as referred by Dr. Jerilynn Buddle for further evaluation regarding lower abdominal pain.   In ED 6/9 tested positive for Covid      Latest Ref Rng & Units 09/05/2023    3:33 PM 06/02/2022    3:30 PM 06/04/2021    1:12 PM  CBC  WBC 4.0 - 10.5 K/uL 4.1  11.2  11.8   Hemoglobin 12.0 - 15.0 g/dL 87.5  86.2  89.5   Hematocrit 36.0 - 46.0 % 36.8  40.6  31.7   Platelets 150 - 400 K/uL 200  212  115         Latest Ref Rng & Units 09/05/2023    3:33 PM 06/02/2022    3:30 PM 06/03/2021   10:43 PM  CMP  Glucose 70 - 99 mg/dL 894  97  86   BUN 6 - 20 mg/dL 8  15  5    Creatinine 0.44 - 1.00 mg/dL 9.05  9.04  9.27   Sodium 135 - 145 mmol/L 137  140  134   Potassium 3.5 - 5.1 mmol/L 3.4  3.7  3.3   Chloride 98 - 111 mmol/L 103  106  105   CO2 22 - 32 mmol/L 20  24  20    Calcium 8.9 - 10.3 mg/dL 9.6  9.6  8.8   Total Protein 6.5 - 8.1 g/dL 7.9  8.0  6.0   Total Bilirubin 0.0 - 1.2 mg/dL 0.4  0.8  0.4   Alkaline Phos 38 - 126 U/L 93  83  107   AST 15 - 41 U/L 26  21  30    ALT 0 - 44 U/L 31  16  14      CTAP 06/02/2022: FINDINGS: Lower chest: No acute abnormality.   Hepatobiliary: No focal liver abnormality is seen. No gallstones, gallbladder wall thickening, or biliary dilatation.   Pancreas: Unremarkable. No pancreatic ductal dilatation or surrounding inflammatory changes.   Spleen: Normal in size without focal abnormality.   Adrenals/Urinary Tract: Adrenal glands are unremarkable. Kidneys are normal, without renal calculi, focal lesion, or hydronephrosis. The urinary bladder is poorly distended and subsequently limited in evaluation.   Stomach/Bowel: Stomach is within normal limits. Appendix appears normal. No evidence of bowel wall  thickening, distention, or inflammatory changes.   Vascular/Lymphatic: No significant vascular findings are present. No enlarged abdominal or pelvic lymph nodes.   Reproductive: Uterus and bilateral adnexa are unremarkable.   Other: No abdominal wall hernia or abnormality. No abdominopelvic ascites.   Musculoskeletal: No acute or significant osseous findings.   IMPRESSION: No acute or active process within the abdomen or pelvis.        Past Medical History:  Diagnosis Date   Anxiety    Asthma    Depression    Gestational hypertension 06/04/2021   Past Surgical History:  Procedure Laterality Date   Contracted toe Right 08/2019   FOOT SURGERY     WISDOM TOOTH EXTRACTION     Social History:  Family History:    reports that she has never smoked. She has never used smokeless tobacco. She reports that she does not drink alcohol and does not use drugs. family history includes Anxiety disorder in her mother; Asthma in her sister; Cancer in  her maternal aunt; Diabetes in her father; Heart failure in an other family member; High blood pressure in her mother; Liver disease in her father; Schizophrenia in her mother.  No Known Allergies    Outpatient Encounter Medications as of 09/20/2023  Medication Sig   albuterol  (VENTOLIN  HFA) 108 (90 Base) MCG/ACT inhaler Inhale 1-2 puffs into the lungs every 6 (six) hours as needed for wheezing or shortness of breath. (Patient not taking: Reported on 09/01/2023)   benzonatate  (TESSALON ) 100 MG capsule Take 1 capsule (100 mg total) by mouth every 8 (eight) hours. (Patient not taking: Reported on 09/01/2023)   ondansetron  (ZOFRAN -ODT) 4 MG disintegrating tablet Take 1 tablet (4 mg total) by mouth every 8 (eight) hours as needed.   Facility-Administered Encounter Medications as of 09/20/2023  Medication   medroxyPROGESTERone  (DEPO-PROVERA ) injection 150 mg     REVIEW OF SYSTEMS:  Gen: Denies fever, sweats or chills. No weight loss.  CV:  Denies chest pain, palpitations or edema. Resp: Denies cough, shortness of breath of hemoptysis.  GI: Denies heartburn, dysphagia, stomach or lower abdominal pain. No diarrhea or constipation.  GU: Denies urinary burning, blood in urine, increased urinary frequency or incontinence. MS: Denies joint pain, muscles aches or weakness. Derm: Denies rash, itchiness, skin lesions or unhealing ulcers. Psych: Denies depression, anxiety, memory loss or confusion. Heme: Denies bruising, easy bleeding. Neuro:  Denies headaches, dizziness or paresthesias. Endo:  Denies any problems with DM, thyroid or adrenal function.  PHYSICAL EXAM: There were no vitals taken for this visit. General: in no acute distress. Head: Normocephalic and atraumatic. Eyes:  Sclerae non-icteric, conjunctive pink. Ears: Normal auditory acuity. Mouth: Dentition intact. No ulcers or lesions.  Neck: Supple, no lymphadenopathy or thyromegaly.  Lungs: Clear bilaterally to auscultation without wheezes, crackles or rhonchi. Heart: Regular rate and rhythm. No murmur, rub or gallop appreciated.  Abdomen: Soft, nontender, nondistended. No masses. No hepatosplenomegaly. Normoactive bowel sounds x 4 quadrants.  Rectal:  Musculoskeletal: Symmetrical with no gross deformities. Skin: Warm and dry. No rash or lesions on visible extremities. Extremities: No edema. Neurological: Alert oriented x 4, no focal deficits.  Psychological: Alert and cooperative. Normal mood and affect.  ASSESSMENT AND PLAN:    CC:  Zina Jerilynn LABOR, MD

## 2023-11-18 ENCOUNTER — Ambulatory Visit (INDEPENDENT_AMBULATORY_CARE_PROVIDER_SITE_OTHER)

## 2023-11-18 ENCOUNTER — Other Ambulatory Visit: Payer: Self-pay

## 2023-11-18 VITALS — BP 115/71 | HR 81 | Ht 67.0 in | Wt 159.6 lb

## 2023-11-18 DIAGNOSIS — Z3042 Encounter for surveillance of injectable contraceptive: Secondary | ICD-10-CM | POA: Diagnosis not present

## 2023-11-18 MED ORDER — MEDROXYPROGESTERONE ACETATE 150 MG/ML IM SUSY
150.0000 mg | PREFILLED_SYRINGE | Freq: Once | INTRAMUSCULAR | Status: AC
  Administered 2023-11-18: 150 mg via INTRAMUSCULAR

## 2023-11-18 NOTE — Progress Notes (Signed)
 Kendra Barajas here for Depo-Provera  Injection. Injection administered without complication. Patient will return in 3 months for next injection between November 7 and November 21. Next annual visit due 11/2023.  Pt agreed to appt scheduled on 02/03/24 at 0920.    Lisset Ketchem, RN 11/18/2023  9:36 AM

## 2023-12-08 ENCOUNTER — Other Ambulatory Visit: Payer: Self-pay

## 2023-12-11 ENCOUNTER — Emergency Department (HOSPITAL_BASED_OUTPATIENT_CLINIC_OR_DEPARTMENT_OTHER)
Admission: EM | Admit: 2023-12-11 | Discharge: 2023-12-11 | Disposition: A | Discharge status: Home / Self Care | Attending: Emergency Medicine | Admitting: Emergency Medicine

## 2023-12-11 ENCOUNTER — Encounter (HOSPITAL_BASED_OUTPATIENT_CLINIC_OR_DEPARTMENT_OTHER): Payer: Self-pay

## 2023-12-11 ENCOUNTER — Emergency Department (HOSPITAL_BASED_OUTPATIENT_CLINIC_OR_DEPARTMENT_OTHER): Admitting: Radiology

## 2023-12-11 ENCOUNTER — Other Ambulatory Visit: Payer: Self-pay

## 2023-12-11 DIAGNOSIS — W228XXA Striking against or struck by other objects, initial encounter: Secondary | ICD-10-CM | POA: Diagnosis not present

## 2023-12-11 DIAGNOSIS — M79674 Pain in right toe(s): Secondary | ICD-10-CM | POA: Diagnosis present

## 2023-12-11 DIAGNOSIS — S90121A Contusion of right lesser toe(s) without damage to nail, initial encounter: Secondary | ICD-10-CM

## 2023-12-11 MED ORDER — NAPROXEN 500 MG PO TABS: 500.0000 mg | ORAL_TABLET | Freq: Two times a day (BID) | ORAL | 0 refills | Status: AC | PRN

## 2023-12-11 MED ORDER — IBUPROFEN 800 MG PO TABS
800.0000 mg | ORAL_TABLET | Freq: Once | ORAL | Status: AC
Start: 2023-12-11 — End: 2023-12-11
  Administered 2023-12-11: 800 mg via ORAL
  Filled 2023-12-11: qty 1

## 2023-12-11 NOTE — ED Triage Notes (Signed)
 Patient here POV from Home.  Endorses kicking a fridge tonight and injured her 2nd toe.  NAD noted during Triage. A&Ox4. GCS 15. Ambulatory.

## 2023-12-11 NOTE — ED Provider Notes (Signed)
 [place] EMERGENCY DEPARTMENT AT [facility] Provider Note   CSN: [id number] Arrival date & time: 12/11/23  0041     Patient presents with: Toe Injury   Kendra Barajas is a 23 y.o. female.   Right second toe pain after kicking a bug about 30 minutes ago.  Did not take anything at home for pain.  Did not fall to the ground.  Concerned because she had hammertoe surgery about a year ago.  No other injury.  No head, neck, back, chest or abdominal pain.  No focal weakness, numbness or tingling.  The history is provided by the patient.       Prior to Admission medications   Medication Sig Start Date End Date Taking? Authorizing Provider  albuterol  (VENTOLIN  HFA) 108 (90 Base) MCG/ACT inhaler Inhale 1-2 puffs into the lungs every 6 (six) hours as needed for wheezing or shortness of breath. Patient not taking: Reported on 09/01/2023 05/13/23   Barajas, Kendra LABOR, PA-C  benzonatate  (TESSALON ) 100 MG capsule Take 1 capsule (100 mg total) by mouth every 8 (eight) hours. Patient not taking: Reported on 09/01/2023 05/13/23   Barajas, Kendra LABOR, PA-C  medroxyPROGESTERone  (DEPO-PROVERA ) 150 MG/ML injection Inject 150 mg into the muscle every 3 (three) months.    [provider]  ondansetron  (ZOFRAN -ODT) 4 MG disintegrating tablet Take 1 tablet (4 mg total) by mouth every 8 (eight) hours as needed. 09/05/23   Kendra Lot, MD    Allergies: Patient has no known allergies.    Review of Systems  Constitutional:  Negative for activity change, appetite change and fever.  HENT:  Negative for congestion and rhinorrhea.   Respiratory:  Negative for cough, chest tightness and shortness of breath.   Cardiovascular:  Negative for chest pain.  Gastrointestinal:  Negative for abdominal pain, nausea and vomiting.  Genitourinary:  Negative for dysuria and hematuria.  Musculoskeletal:  Positive for arthralgias and myalgias.  Skin:  Negative for rash.  Neurological:  Negative for dizziness,  weakness and headaches.   all other systems are negative except as noted in the HPI and PMH.    Updated Vital Signs BP (!) 149/83 (BP Location: Right Arm)   Pulse 97   Temp 98.5 F (36.9 C) (Oral)   Resp 18   Ht 5' 7 (1.702 m)   Wt 69.9 kg   SpO2 99%   BMI 24.12 kg/m   Physical Exam Vitals and nursing note reviewed.  Constitutional:      General: She is not in acute distress.    Appearance: She is well-developed.  HENT:     Head: Normocephalic and atraumatic.     Mouth/Throat:     Pharynx: No oropharyngeal exudate.  Eyes:     Conjunctiva/sclera: Conjunctivae normal.     Pupils: Pupils are equal, round, and reactive to light.  Neck:     Comments: No meningismus. Cardiovascular:     Rate and Rhythm: Normal rate and regular rhythm.     Heart sounds: Normal heart sounds. No murmur heard. Pulmonary:     Effort: Pulmonary effort is normal. No respiratory distress.     Breath sounds: Normal breath sounds.  Abdominal:     Palpations: Abdomen is soft.     Tenderness: There is no abdominal tenderness. There is no guarding or rebound.  Musculoskeletal:        General: Tenderness present. Normal range of motion.     Cervical back: Normal range of motion and neck supple.  Comments: Tenderness right second toe without deformity.  Intact DP and PT pulse.  Skin:    General: Skin is warm.  Neurological:     Mental Status: She is alert and oriented to person, place, and time.     Cranial Nerves: No cranial nerve deficit.     Motor: No abnormal muscle tone.     Coordination: Coordination normal.     Comments:  5/5 strength throughout. CN 2-12 intact.Equal grip strength.   Psychiatric:        Behavior: Behavior normal.     (all labs ordered are listed, but only abnormal results are displayed) Labs Reviewed - No data to display  EKG: None  Radiology: DG Foot Complete Right Result Date: 12/11/2023 EXAM: 3 or more VIEW(S) XRAY OF THE FOOT 12/11/2023 01:06:00 AM  COMPARISON: None available. CLINICAL HISTORY: Toe pain [id number]. Pt states she tried to kick a bug and instead kicked the freezer. Pt states 2nd digit is hurting. Pt shielded. FINDINGS: BONES AND JOINTS: No acute fracture. No focal osseous lesion. No joint dislocation. SOFT TISSUES: The soft tissues are unremarkable. IMPRESSION: 1. No acute fracture or dislocation. Electronically signed by: Kendra Molt MD 12/11/2023 01:10 AM EDT RP Workstation: [id number]     Procedures   Medications Ordered in the ED - No data to display                                  Medical Decision Making Amount and/or Complexity of Data Reviewed Labs: ordered. Decision-making details documented in ED Course. Radiology: ordered and independent interpretation performed. Decision-making details documented in ED Course. ECG/medicine tests: ordered and independent interpretation performed. Decision-making details documented in ED Course.  Risk Prescription drug management.   Right second toe pain after blunt trauma.  NeuroVascularly intact  X-rays negative for fracture or dislocation.  Results reviewed and interpreted by me.  Will give postoperative shoe, anti-inflammatories, ice, elevation and pain control.  Follow-up with PCP and/or foot doctor.  Return precautions discussed.     Final diagnoses:  None    ED Discharge Orders     None          Kendra Barajas, Garnette, MD 12/11/23 0140

## 2023-12-11 NOTE — Discharge Instructions (Signed)
 X-ray is negative.  Take anti-inflammatories as prescribed and follow-up with your foot doctor.  Return to the ED with new or worsening symptoms.

## 2024-02-03 ENCOUNTER — Ambulatory Visit (INDEPENDENT_AMBULATORY_CARE_PROVIDER_SITE_OTHER)

## 2024-02-03 ENCOUNTER — Other Ambulatory Visit (HOSPITAL_COMMUNITY)
Admission: RE | Admit: 2024-02-03 | Discharge: 2024-02-03 | Disposition: A | Source: Ambulatory Visit | Attending: Family Medicine | Admitting: Family Medicine

## 2024-02-03 ENCOUNTER — Other Ambulatory Visit: Payer: Self-pay

## 2024-02-03 VITALS — BP 110/70 | HR 72 | Ht 67.0 in | Wt 156.1 lb

## 2024-02-03 DIAGNOSIS — Z202 Contact with and (suspected) exposure to infections with a predominantly sexual mode of transmission: Secondary | ICD-10-CM | POA: Insufficient documentation

## 2024-02-03 DIAGNOSIS — Z3042 Encounter for surveillance of injectable contraceptive: Secondary | ICD-10-CM

## 2024-02-03 MED ORDER — MEDROXYPROGESTERONE ACETATE 150 MG/ML IM SUSY
150.0000 mg | PREFILLED_SYRINGE | Freq: Once | INTRAMUSCULAR | Status: AC
  Administered 2024-02-03: 150 mg via INTRAMUSCULAR

## 2024-02-03 NOTE — Progress Notes (Signed)
 Kendra Barajas here for Depo-Provera  Injection. Last injection 11/18/23. Injection administered today without complication.  Patient will return in 3 months for next injection between 04/20/24 and 05/04/24.   Patient is also requesting to do a self-swab due to new sexual partner and wants to be sure everything is ok. I explained how to do a self-swab and patient verbalized understanding. I let the patient know if anything is abnormal on the swab we will be in contact. Patient is due for an annual visit and patient will schedule at checkout. Patient verbalizes no further questions or concerns.   Devon, RN 02/03/2024

## 2024-02-06 ENCOUNTER — Ambulatory Visit: Payer: Self-pay | Admitting: Family Medicine

## 2024-02-06 LAB — CERVICOVAGINAL ANCILLARY ONLY
Bacterial Vaginitis (gardnerella): POSITIVE — AB
Candida Glabrata: NEGATIVE
Candida Vaginitis: NEGATIVE
Chlamydia: NEGATIVE
Comment: NEGATIVE
Comment: NEGATIVE
Comment: NEGATIVE
Comment: NEGATIVE
Comment: NEGATIVE
Comment: NORMAL
Neisseria Gonorrhea: NEGATIVE
Trichomonas: NEGATIVE

## 2024-02-06 MED ORDER — METRONIDAZOLE 0.75 % VA GEL: 1.0000 | Freq: Every day | VAGINAL | 0 refills | Status: AC

## 2024-02-29 ENCOUNTER — Ambulatory Visit: Admitting: Family Medicine

## 2024-02-29 ENCOUNTER — Other Ambulatory Visit: Payer: Self-pay

## 2024-02-29 VITALS — BP 113/73 | HR 84 | Wt 151.5 lb

## 2024-02-29 DIAGNOSIS — Z23 Encounter for immunization: Secondary | ICD-10-CM | POA: Diagnosis not present

## 2024-02-29 DIAGNOSIS — Z01419 Encounter for gynecological examination (general) (routine) without abnormal findings: Secondary | ICD-10-CM

## 2024-02-29 DIAGNOSIS — Z30430 Encounter for insertion of intrauterine contraceptive device: Secondary | ICD-10-CM | POA: Diagnosis not present

## 2024-02-29 DIAGNOSIS — R519 Headache, unspecified: Secondary | ICD-10-CM

## 2024-02-29 MED ORDER — LEVONORGESTREL 20 MCG/DAY IU IUD
1.0000 | INTRAUTERINE_SYSTEM | Freq: Once | INTRAUTERINE | Status: AC
  Administered 2024-02-29: 1 via INTRAUTERINE

## 2024-02-29 NOTE — Progress Notes (Signed)
 ANNUAL EXAM Patient name: Kendra Barajas MRN [id number]  Date of birth: [birth year] Chief Complaint:   Gynecologic Exam  History of Present Illness:   Kendra Barajas is a 23 y.o. G54P1001 African-American female being seen today for a routine annual exam.  Current complaints: Contraceptive management, recurrent headaches, daily nosebleeds  Recurrent headaches and daily nosebleeds Patient reporting issues with headaches for the last 4 months.  They are every day but they are getting worse.  She also reports daily nosebleeds for the last 3 months.  Patient reports that her frontal headaches will last for 30 minutes to an hour.  She reports sensitivity to light.  The only thing that helps him go away is time.  She has not take medications regularly.    No LMP recorded. Patient has had an injection.   The pregnancy intention screening data noted above was reviewed. Potential methods of contraception were discussed. The patient elected to proceed with No data recorded.   Last pap 06/09/2022. Results were: NILM w/ HRHPV not done. H/O abnormal pap: yes ASCUS with high risk HPV on April 02/2022 Last mammogram: Never done. Last colonoscopy: Never done     02/29/2024   11:11 AM 04/04/2023    3:03 PM 05/29/2021    9:30 AM 05/22/2021   11:19 AM 05/13/2021   11:04 AM  Depression screen PHQ 2/9  Decreased Interest 0 0 1 1 1   Down, Depressed, Hopeless 0 0 0 0 1  PHQ - 2 Score 0 0 1 1 2   Altered sleeping 0 1 2 1 1   Tired, decreased energy 1 1 2 1  0  Change in appetite 1 0 0 0 0  Feeling bad or failure about yourself  0 0 0 0 1  Trouble concentrating 0 0 0 0 0  Moving slowly or fidgety/restless 0 0 0 0 0  Suicidal thoughts 0 0 0 0 0  PHQ-9 Score 2 2  5  3  4    Difficult doing work/chores Not difficult at all  Not difficult at all  Not difficult at all     Data saved with a previous flowsheet row definition        02/29/2024   11:11 AM 04/04/2023    3:03 PM 05/29/2021    9:30 AM 05/22/2021   11:21  AM  GAD 7 : Generalized Anxiety Score  Nervous, Anxious, on Edge 0 0 2 1  Control/stop worrying 1 0 2 1  Worry too much - different things 1 1 2 1   Trouble relaxing 0 0 0 0  Restless 0 0 0 0  Easily annoyed or irritable 0 1 1 1   Afraid - awful might happen 0 0 0 0  Total GAD 7 Score 2 2 7 4   Anxiety Difficulty Not difficult at all  Not difficult at all      Review of Systems:   Pertinent items are noted in HPI Denies any headaches, blurred vision, fatigue, shortness of breath, chest pain, abdominal pain, abnormal vaginal discharge/itching/odor/irritation, problems with periods, bowel movements, urination, or intercourse unless otherwise stated above. Pertinent History Reviewed:  Reviewed past medical,surgical, social and family history.  Reviewed problem list, medications and allergies. Physical Assessment:   Vitals:   02/29/24 1101  BP: 113/73  Pulse: 84  Weight: 151 lb 8 oz (68.7 kg)  Body mass index is 23.73 kg/m.        Physical Examination:   General appearance - well appearing, and in no distress  Mental status -  alert, oriented to person, place, and time  Psych:  She has a normal mood and affect  Skin - warm and dry, normal color, no suspicious lesions noted  Chest - effort normal, all lung fields clear to auscultation bilaterally  Heart - normal rate and regular rhythm  Neck:  midline trachea, no thyromegaly or nodules  Abdomen - soft, nontender, nondistended, no masses or organomegaly  Pelvic - VULVA: normal appearing vulva with no masses, tenderness or lesions  VAGINA: normal appearing vagina with normal color and discharge, no lesions  CERVIX: normal appearing cervix without discharge or lesions, no CMT  UTERUS: uterus is felt to be normal size, shape, consistency and nontender   ADNEXA: No adnexal masses or tenderness noted.  Rectal - normal rectal, good sphincter tone, no masses felt.   Extremities:  No swelling or varicosities noted  Chaperone present for  exam  IUD INSERTION: Patient given informed consent, signed copy in the chart..  Negative pregnancy confirmed.  Appropriate time out taken.   Sterile instruments and technique was used. Cervix brought into view with use of speculum and then cleansed three times with  betadine swabs.  A tenaculum was placed into the anterior lip of the cervix and a uterine sound was used to measure uterine size. 8 cm.    A Mirena  IUD was placed into the endometrial cavity, deployed and secured. The applicator was removed. The strings were trimmed to 2 centimeters.   There were no complications and the patient tolerated the procedure well.   She was given handouts for post procedure instructions and information about the IUD including a card with the time of recommended removal. She was reminded that the IUD does not protect against sexually transmitted diseases.  No results found for this or any previous visit (from the past 24 hours).  Assessment & Plan:  1) Well-Woman Exam  2) recurrent headaches-patient presenting with daily headaches for the last 4 months.  Notices photophobia.  Has also been having daily nosebleeds for the last 3 months.  Reports the headaches are getting worse.  She has been on Depo since 23 years old other than the break while she was pregnant.  Currently on Depo.  Will get brain MRI.  Counseled on treatments for the recurrent headaches and return precautions.   Labs/procedures today: IUD placement  Mammogram: @ 23yo, or sooner if problems Colonoscopy: @ 23yo, or sooner if problems  Orders Placed This Encounter  Procedures   MR BRAIN W WO CONTRAST   Flu vaccine trivalent PF, 6mos and older(Flulaval,Afluria,Fluarix,Fluzone)    Meds: No orders of the defined types were placed in this encounter.   Follow-up: No follow-ups on file.  Yoshi Vicencio V Markise Haymer, MD 02/29/2024 11:21 AM

## 2024-03-26 ENCOUNTER — Encounter: Payer: Self-pay | Admitting: Family Medicine

## 2024-03-30 ENCOUNTER — Ambulatory Visit
Admission: RE | Admit: 2024-03-30 | Discharge: 2024-03-30 | Disposition: A | Discharge status: Home / Self Care | Source: Ambulatory Visit | Attending: Family Medicine | Admitting: Family Medicine

## 2024-03-30 DIAGNOSIS — R519 Headache, unspecified: Secondary | ICD-10-CM

## 2024-03-30 MED ORDER — GADOPICLENOL 0.5 MMOL/ML IV SOLN
7.0000 mL | Freq: Once | INTRAVENOUS | Status: AC | PRN
  Administered 2024-03-30: 7 mL via INTRAVENOUS

## 2024-04-03 ENCOUNTER — Other Ambulatory Visit: Payer: Self-pay

## 2024-04-03 ENCOUNTER — Ambulatory Visit (HOSPITAL_COMMUNITY)
Admission: EM | Admit: 2024-04-03 | Discharge: 2024-04-03 | Disposition: A | Discharge status: Home / Self Care | Attending: Physician Assistant | Admitting: Physician Assistant

## 2024-04-03 ENCOUNTER — Encounter (HOSPITAL_COMMUNITY): Payer: Self-pay | Admitting: *Deleted

## 2024-04-03 DIAGNOSIS — N898 Other specified noninflammatory disorders of vagina: Secondary | ICD-10-CM | POA: Insufficient documentation

## 2024-04-03 MED ORDER — METRONIDAZOLE 0.75 % VA GEL: 1.0000 | Freq: Two times a day (BID) | VAGINAL | 0 refills | Status: AC

## 2024-04-03 NOTE — Discharge Instructions (Addendum)
Your test are pending.   

## 2024-04-03 NOTE — ED Provider Notes (Signed)
 " [facility]    CSN: [id number] Arrival date & time: 04/03/24  9167      History   Chief Complaint Chief Complaint  Patient presents with   Vaginal Itching    HPI Kendra Barajas is a 24 y.o. female.   Pt complains of vaginal irritation.  Pt reports she has a history of bacterial vaginitis.  Pt reports this feels the same.   The history is provided by the patient. No language interpreter was used.  Vaginal Itching This is a new problem. The current episode started yesterday.    Past Medical History:  Diagnosis Date   Anxiety    Asthma    Depression    Gestational hypertension 06/04/2021    Patient Active Problem List   Diagnosis Date Noted   ASCUS with positive high risk HPV cervical 07/08/2021   Vitamin D deficiency 01/22/2020   Atopic dermatitis 11/23/2017    Past Surgical History:  Procedure Laterality Date   Contracted toe Right 08/2019   FOOT SURGERY     WISDOM TOOTH EXTRACTION      OB History     Gravida  1   Para  1   Term  1   Preterm  0   AB  0   Living  1      SAB  0   IAB  0   Ectopic  0   Multiple  0   Live Births  1            Home Medications    Prior to Admission medications  Medication Sig Start Date End Date Taking? Authorizing Provider  metroNIDAZOLE  (METROGEL ) 0.75 % vaginal gel Place 1 Applicatorful vaginally 2 (two) times daily. 04/03/24  Yes Raymont Andreoni K, PA-C  albuterol  (VENTOLIN  HFA) 108 (90 Base) MCG/ACT inhaler Inhale 1-2 puffs into the lungs every 6 (six) hours as needed for wheezing or shortness of breath. Patient not taking: Reported on 02/03/2024 05/13/23   Smoot, Lauraine LABOR, PA-C  benzonatate  (TESSALON ) 100 MG capsule Take 1 capsule (100 mg total) by mouth every 8 (eight) hours. Patient not taking: Reported on 02/03/2024 05/13/23   Smoot, Lauraine LABOR, PA-C  medroxyPROGESTERone  (DEPO-PROVERA ) 150 MG/ML injection Inject 150 mg into the muscle every 3 (three) months. Patient not taking: No sig reported     [provider]  naproxen  (NAPROSYN ) 500 MG tablet Take 1 tablet (500 mg total) by mouth 2 (two) times daily as needed for moderate pain (pain score 4-6). Patient not taking: Reported on 02/03/2024 12/11/23   Carita Senior, MD  ondansetron  (ZOFRAN -ODT) 4 MG disintegrating tablet Take 1 tablet (4 mg total) by mouth every 8 (eight) hours as needed. Patient not taking: Reported on 02/03/2024 09/05/23   Patsey Lot, MD    Family History Family History  Problem Relation Age of Onset   High blood pressure Mother    Anxiety disorder Mother    Schizophrenia Mother    Diabetes Father    Liver disease Father    Asthma Sister    Cancer Maternal Aunt    Heart failure Other     Social History Social History[1]   Allergies   Patient has no known allergies.   Review of Systems Review of Systems  All other systems reviewed and are negative.    Physical Exam Triage Vital Signs ED Triage Vitals  Encounter Vitals Group     BP 04/03/24 0927 109/75     Girls Systolic BP Percentile --  Girls Diastolic BP Percentile --      Boys Systolic BP Percentile --      Boys Diastolic BP Percentile --      Pulse Rate 04/03/24 0927 69     Resp 04/03/24 0927 16     Temp 04/03/24 0927 98.3 F (36.8 C)     Temp src --      SpO2 04/03/24 0927 100 %     Weight --      Height --      Head Circumference --      Peak Flow --      Pain Score 04/03/24 0925 0     Pain Loc --      Pain Education --      Exclude from Growth Chart --    No data found.  Updated Vital Signs BP 109/75   Pulse 69   Temp 98.3 F (36.8 C)   Resp 16   SpO2 100%   Visual Acuity Right Eye Distance:   Left Eye Distance:   Bilateral Distance:    Right Eye Near:   Left Eye Near:    Bilateral Near:     Physical Exam Vitals and nursing note reviewed.  Constitutional:      Appearance: She is well-developed.  HENT:     Head: Normocephalic.  Cardiovascular:     Rate and Rhythm: Normal rate.   Pulmonary:     Effort: Pulmonary effort is normal.  Abdominal:     General: There is no distension.  Musculoskeletal:        General: Normal range of motion.     Cervical back: Normal range of motion.  Skin:    General: Skin is warm.  Neurological:     General: No focal deficit present.     Mental Status: She is alert and oriented to person, place, and time.      UC Treatments / Results  Labs (all labs ordered are listed, but only abnormal results are displayed) Labs Reviewed  CERVICOVAGINAL ANCILLARY ONLY    EKG   Radiology No results found.  Procedures Procedures (including critical care time)  Medications Ordered in UC Medications - No data to display  Initial Impression / Assessment and Plan / UC Course  I have reviewed the triage vital signs and the nursing notes.  Pertinent labs & imaging results that were available during my care of the patient were reviewed by me and considered in my medical decision making (see chart for details).     Pt reports she has had BV.  Pt reports this feels the same.   Final Clinical Impressions(s) / UC Diagnoses   Final diagnoses:  Vaginal itching     Discharge Instructions      Your test are pending   ED Prescriptions     Medication Sig Dispense Auth. Provider   metroNIDAZOLE  (METROGEL ) 0.75 % vaginal gel Place 1 Applicatorful vaginally 2 (two) times daily. 70 g Deborha Moseley K, PA-C      PDMP not reviewed this encounter. An After Visit Summary was printed and given to the patient.        [1]  Social History Tobacco Use   Smoking status: Never   Smokeless tobacco: Never  Vaping Use   Vaping status: Never Used  Substance Use Topics   Alcohol use: No   Drug use: No     Flint Sonny POUR, PA-C 04/03/24 1109  "

## 2024-04-03 NOTE — ED Triage Notes (Signed)
 PT reports vaginal itching since Monday. Pt denies any vag discharge .

## 2024-04-04 LAB — CERVICOVAGINAL ANCILLARY ONLY
Bacterial Vaginitis (gardnerella): NEGATIVE
Candida Glabrata: NEGATIVE
Candida Vaginitis: NEGATIVE
Chlamydia: NEGATIVE
Comment: NEGATIVE
Comment: NEGATIVE
Comment: NEGATIVE
Comment: NEGATIVE
Comment: NEGATIVE
Comment: NORMAL
Neisseria Gonorrhea: NEGATIVE
Trichomonas: NEGATIVE

## 2024-04-05 ENCOUNTER — Encounter: Payer: Self-pay | Admitting: Family Medicine

## 2024-04-20 ENCOUNTER — Ambulatory Visit
